# Patient Record
Sex: Male | Born: 1955
Health system: Southern US, Community
[De-identification: ages and names within clinical notes are randomized; demographics above are authoritative.]

## PROBLEM LIST (undated history)

## (undated) DIAGNOSIS — I10 Essential (primary) hypertension: Secondary | ICD-10-CM

## (undated) DIAGNOSIS — IMO0002 Reserved for concepts with insufficient information to code with codable children: Secondary | ICD-10-CM

## (undated) DIAGNOSIS — K219 Gastro-esophageal reflux disease without esophagitis: Secondary | ICD-10-CM

## (undated) DIAGNOSIS — R7303 Prediabetes: Secondary | ICD-10-CM

## (undated) DIAGNOSIS — G4730 Sleep apnea, unspecified: Secondary | ICD-10-CM

## (undated) DIAGNOSIS — R519 Headache, unspecified: Secondary | ICD-10-CM

## (undated) DIAGNOSIS — Z86010 Personal history of colonic polyps: Secondary | ICD-10-CM

## (undated) HISTORY — DX: Prediabetes: R73.03

## (undated) HISTORY — DX: Reserved for concepts with insufficient information to code with codable children: IMO0002

## (undated) HISTORY — PX: TONSILLECTOMY: SUR1361

## (undated) HISTORY — DX: Essential (primary) hypertension: I10

## (undated) HISTORY — DX: Personal history of colonic polyps: Z86.010

---
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
## 1988-10-31 HISTORY — PX: VASECTOMY: SHX75

## 2000-03-20 DIAGNOSIS — I10 Essential (primary) hypertension: Secondary | ICD-10-CM

## 2000-03-20 HISTORY — DX: Essential (primary) hypertension: I10

## 2006-03-22 DIAGNOSIS — G43909 Migraine, unspecified, not intractable, without status migrainosus: Secondary | ICD-10-CM | POA: Insufficient documentation

## 2006-03-23 DIAGNOSIS — E781 Pure hyperglyceridemia: Secondary | ICD-10-CM | POA: Insufficient documentation

## 2006-05-09 ENCOUNTER — Ambulatory Visit: Payer: Self-pay | Admitting: General Surgery

## 2006-05-09 DIAGNOSIS — Z860100 Personal history of colon polyps, unspecified: Secondary | ICD-10-CM

## 2006-05-09 HISTORY — DX: Personal history of colonic polyps: Z86.010

## 2006-05-09 HISTORY — DX: Personal history of colon polyps, unspecified: Z86.0100

## 2006-10-03 ENCOUNTER — Ambulatory Visit: Payer: Self-pay

## 2006-10-11 ENCOUNTER — Other Ambulatory Visit: Payer: Self-pay

## 2006-10-13 ENCOUNTER — Ambulatory Visit: Payer: Self-pay | Admitting: Orthopaedic Surgery

## 2008-07-21 ENCOUNTER — Ambulatory Visit: Payer: Self-pay | Admitting: Family Medicine

## 2009-06-16 ENCOUNTER — Ambulatory Visit: Payer: Self-pay | Admitting: General Surgery

## 2009-07-27 DIAGNOSIS — H8110 Benign paroxysmal vertigo, unspecified ear: Secondary | ICD-10-CM | POA: Insufficient documentation

## 2009-09-02 DIAGNOSIS — F900 Attention-deficit hyperactivity disorder, predominantly inattentive type: Secondary | ICD-10-CM | POA: Insufficient documentation

## 2012-02-20 ENCOUNTER — Ambulatory Visit: Payer: Self-pay | Admitting: Gastroenterology

## 2012-02-20 HISTORY — PX: UPPER GI ENDOSCOPY: SHX6162

## 2012-10-25 ENCOUNTER — Emergency Department: Payer: Self-pay | Admitting: Emergency Medicine

## 2012-10-25 LAB — URINALYSIS, COMPLETE
Bilirubin,UR: NEGATIVE
Glucose,UR: NEGATIVE mg/dL (ref 0–75)
Ketone: NEGATIVE
Ph: 5 (ref 4.5–8.0)
Protein: NEGATIVE
RBC,UR: 12 /HPF (ref 0–5)
Squamous Epithelial: <1
WBC UR: 4 /HPF (ref 0–5)

## 2012-10-25 LAB — CBC
HCT: 44.9 % (ref 40.0–52.0)
MCH: 32.5 pg (ref 26.0–34.0)
MCHC: 35.9 g/dL (ref 32.0–36.0)
RDW: 13.3 % (ref 11.5–14.5)

## 2012-10-25 LAB — COMPREHENSIVE METABOLIC PANEL
Albumin: 4.6 g/dL (ref 3.4–5.0)
Anion Gap: 11 (ref 7–16)
Calcium, Total: 9 mg/dL (ref 8.5–10.1)
Chloride: 107 mmol/L (ref 98–107)
Co2: 24 mmol/L (ref 21–32)
Creatinine: 1.41 mg/dL — ABNORMAL HIGH (ref 0.60–1.30)
EGFR (African American): >60
Glucose: 172 mg/dL — ABNORMAL HIGH (ref 65–99)
Osmolality: 289 (ref 275–301)
Potassium: 3.3 mmol/L — ABNORMAL LOW (ref 3.5–5.1)
Sodium: 142 mmol/L (ref 136–145)
Total Protein: 7.1 g/dL (ref 6.4–8.2)

## 2013-07-30 ENCOUNTER — Ambulatory Visit: Payer: Self-pay | Admitting: Unknown Physician Specialty

## 2013-08-26 LAB — PSA: PSA: 1.3

## 2013-09-16 ENCOUNTER — Ambulatory Visit: Payer: Self-pay | Admitting: Unknown Physician Specialty

## 2013-09-16 HISTORY — PX: UPPER GI ENDOSCOPY: SHX6162

## 2013-09-18 LAB — PATHOLOGY REPORT

## 2014-03-05 ENCOUNTER — Ambulatory Visit: Payer: Self-pay | Admitting: Family Medicine

## 2014-12-02 LAB — LIPID PANEL
Cholesterol: 160 mg/dL (ref 0–200)
HDL: 37 mg/dL (ref 35–70)
LDL Cholesterol: 60 mg/dL
Triglycerides: 316 mg/dL — AB (ref 40–160)

## 2014-12-02 LAB — BASIC METABOLIC PANEL
BUN: 10 mg/dL (ref 4–21)
CREATININE: 0.9 mg/dL (ref 0.6–1.3)
GLUCOSE: 104 mg/dL
POTASSIUM: 4.2 mmol/L (ref 3.4–5.3)
Sodium: 146 mmol/L (ref 137–147)

## 2014-12-02 LAB — HM COLONOSCOPY

## 2015-01-05 ENCOUNTER — Ambulatory Visit: Payer: Self-pay | Admitting: Neurology

## 2015-07-13 ENCOUNTER — Ambulatory Visit (INDEPENDENT_AMBULATORY_CARE_PROVIDER_SITE_OTHER): Payer: 59 | Admitting: Family Medicine

## 2015-07-13 ENCOUNTER — Encounter: Payer: Self-pay | Admitting: Family Medicine

## 2015-07-13 VITALS — BP 148/84 | HR 68 | Temp 97.8°F | Resp 16 | Ht 69.5 in | Wt 225.0 lb

## 2015-07-13 DIAGNOSIS — Z0000 Encounter for general adult medical examination without abnormal findings: Secondary | ICD-10-CM | POA: Diagnosis not present

## 2015-07-13 DIAGNOSIS — I10 Essential (primary) hypertension: Secondary | ICD-10-CM

## 2015-07-13 DIAGNOSIS — Z86010 Personal history of colonic polyps: Secondary | ICD-10-CM

## 2015-07-13 DIAGNOSIS — E559 Vitamin D deficiency, unspecified: Secondary | ICD-10-CM | POA: Insufficient documentation

## 2015-07-13 DIAGNOSIS — Z125 Encounter for screening for malignant neoplasm of prostate: Secondary | ICD-10-CM

## 2015-07-13 DIAGNOSIS — N400 Enlarged prostate without lower urinary tract symptoms: Secondary | ICD-10-CM | POA: Insufficient documentation

## 2015-07-13 DIAGNOSIS — M79606 Pain in leg, unspecified: Secondary | ICD-10-CM | POA: Insufficient documentation

## 2015-07-13 DIAGNOSIS — R413 Other amnesia: Secondary | ICD-10-CM | POA: Insufficient documentation

## 2015-07-13 DIAGNOSIS — R1310 Dysphagia, unspecified: Secondary | ICD-10-CM | POA: Insufficient documentation

## 2015-07-13 DIAGNOSIS — R209 Unspecified disturbances of skin sensation: Secondary | ICD-10-CM | POA: Insufficient documentation

## 2015-07-13 DIAGNOSIS — L821 Other seborrheic keratosis: Secondary | ICD-10-CM | POA: Insufficient documentation

## 2015-07-13 DIAGNOSIS — Z23 Encounter for immunization: Secondary | ICD-10-CM

## 2015-07-13 NOTE — Patient Instructions (Signed)
DASH Eating Plan °DASH stands for "Dietary Approaches to Stop Hypertension." The DASH eating plan is a healthy eating plan that has been shown to reduce high blood pressure (hypertension). Additional health benefits may include reducing the risk of type 2 diabetes mellitus, heart disease, and stroke. The DASH eating plan may also help with weight loss. °WHAT DO I NEED TO KNOW ABOUT THE DASH EATING PLAN? °For the DASH eating plan, you will follow these general guidelines: °· Choose foods with a percent daily value for sodium of less than 5% (as listed on the food label). °· Use salt-free seasonings or herbs instead of table salt or sea salt. °· Check with your health care provider or pharmacist before using salt substitutes. °· Eat lower-sodium products, often labeled as "lower sodium" or "no salt added." °· Eat fresh foods. °· Eat more vegetables, fruits, and low-fat dairy products. °· Choose whole grains. Look for the word "whole" as the first word in the ingredient list. °· Choose fish and skinless chicken or turkey more often than red meat. Limit fish, poultry, and meat to 6 oz (170 g) each day. °· Limit sweets, desserts, sugars, and sugary drinks. °· Choose heart-healthy fats. °· Limit cheese to 1 oz (28 g) per day. °· Eat more home-cooked food and less restaurant, buffet, and fast food. °· Limit fried foods. °· Cook foods using methods other than frying. °· Limit canned vegetables. If you do use them, rinse them well to decrease the sodium. °· When eating at a restaurant, ask that your food be prepared with less salt, or no salt if possible. °WHAT FOODS CAN I EAT? °Seek help from a dietitian for individual calorie needs. °Grains °Whole grain or whole wheat bread. Brown rice. Whole grain or whole wheat pasta. Quinoa, bulgur, and whole grain cereals. Low-sodium cereals. Corn or whole wheat flour tortillas. Whole grain cornbread. Whole grain crackers. Low-sodium crackers. °Vegetables °Fresh or frozen vegetables  (raw, steamed, roasted, or grilled). Low-sodium or reduced-sodium tomato and vegetable juices. Low-sodium or reduced-sodium tomato sauce and paste. Low-sodium or reduced-sodium canned vegetables.  °Fruits °All fresh, canned (in natural juice), or frozen fruits. °Meat and Other Protein Products °Ground beef (85% or leaner), grass-fed beef, or beef trimmed of fat. Skinless chicken or turkey. Ground chicken or turkey. Pork trimmed of fat. All fish and seafood. Eggs. Dried beans, peas, or lentils. Unsalted nuts and seeds. Unsalted canned beans. °Dairy °Low-fat dairy products, such as skim or 1% milk, 2% or reduced-fat cheeses, low-fat ricotta or cottage cheese, or plain low-fat yogurt. Low-sodium or reduced-sodium cheeses. °Fats and Oils °Tub margarines without trans fats. Light or reduced-fat mayonnaise and salad dressings (reduced sodium). Avocado. Safflower, olive, or canola oils. Natural peanut or almond butter. °Other °Unsalted popcorn and pretzels. °The items listed above may not be a complete list of recommended foods or beverages. Contact your dietitian for more options. °WHAT FOODS ARE NOT RECOMMENDED? °Grains °White bread. White pasta. White rice. Refined cornbread. Bagels and croissants. Crackers that contain trans fat. °Vegetables °Creamed or fried vegetables. Vegetables in a cheese sauce. Regular canned vegetables. Regular canned tomato sauce and paste. Regular tomato and vegetable juices. °Fruits °Dried fruits. Canned fruit in light or heavy syrup. Fruit juice. °Meat and Other Protein Products °Fatty cuts of meat. Ribs, chicken wings, bacon, sausage, bologna, salami, chitterlings, fatback, hot dogs, bratwurst, and packaged luncheon meats. Salted nuts and seeds. Canned beans with salt. °Dairy °Whole or 2% milk, cream, half-and-half, and cream cheese. Whole-fat or sweetened yogurt. Full-fat   cheeses or blue cheese. Nondairy creamers and whipped toppings. Processed cheese, cheese spreads, or cheese  curds. °Condiments °Onion and garlic salt, seasoned salt, table salt, and sea salt. Canned and packaged gravies. Worcestershire sauce. Tartar sauce. Barbecue sauce. Teriyaki sauce. Soy sauce, including reduced sodium. Steak sauce. Fish sauce. Oyster sauce. Cocktail sauce. Horseradish. Ketchup and mustard. Meat flavorings and tenderizers. Bouillon cubes. Hot sauce. Tabasco sauce. Marinades. Taco seasonings. Relishes. °Fats and Oils °Butter, stick margarine, lard, shortening, ghee, and bacon fat. Coconut, palm kernel, or palm oils. Regular salad dressings. °Other °Pickles and olives. Salted popcorn and pretzels. °The items listed above may not be a complete list of foods and beverages to avoid. Contact your dietitian for more information. °WHERE CAN I FIND MORE INFORMATION? °National [employer], [employer], [employer] Institute: www.nhlbi.nih.gov/health/health-topics/topics/dash/ °Document Released: 10/06/2011 Document Revised: 03/03/2014 Document Reviewed: 08/21/2013 °ExitCare® Patient Information ©2015 [employer]. This information is not intended to replace advice given to you by your health care provider. Make sure you discuss any questions you have with your health care provider. ° °

## 2015-07-13 NOTE — Progress Notes (Signed)
Patient: Arthur Davis, Male    DOB: Oct 11, 1956, 59 y.o.   MRN: [id number] Visit Date: 07/13/2015  Today's Provider: Lelon Huh, MD   Chief Complaint  Patient presents with  . Annual Exam  . Hypertension    follow up  . Hyperlipidemia    follow up   Subjective:    Annual physical exam Arthur Davis is a 59 y.o. male who presents today for health maintenance and complete physical. He feels well. He reports exercising  2-3 times a week. He reports he is sleeping poorly.  -----------------------------------------------------------------   Hypertension, follow-up:  BP Readings from Last 3 Encounters:  12/02/14 124/72    He was last seen for hypertension 7 months ago.  BP at that visit was 124/72. Management since that visit includes  none. He reports good compliance with treatment. He is not having side effects.   He is exercising. He is adherent to low salt diet.   Outside blood pressures are  253-664 systolic over 40-347 diastolic. He is experiencing lower extremity edema.  Patient denies chest pain, chest pressure/discomfort, claudication, dyspnea, exertional chest pressure/discomfort, fatigue, irregular heart beat, near-syncope, orthopnea, palpitations, paroxysmal nocturnal dyspnea, syncope and tachypnea.   Cardiovascular risk factors include advanced age (older than 50 for men, 53 for women), hypertension and male gender.  Use of agents associated with hypertension: none.     Weight trend: increasing steadily Wt Readings from Last 3 Encounters:  12/02/14 218 lb (98.884 kg)    Current diet: in general, a "healthy" diet    ------------------------------------------------------------------------   Lipid/Cholesterol, Follow-up:   Last seen for this7 months ago.  Management changes since that visit include  none. . Last Lipid Panel:    Component Value Date/Time   CHOL 160 12/02/2014   TRIG 316* 12/02/2014   HDL 37 12/02/2014   LDLCALC 60  12/02/2014    Risk factors for vascular disease include hypertension  He reports good compliance with treatment. He is not having side effects.  Current symptoms include none and have been stable. Weight trend: increasing steadily Prior visit with dietician: no Current diet: in general, a "healthy" diet   Current exercise: cardio and weights  Wt Readings from Last 3 Encounters:  12/02/14 218 lb (98.884 kg)    ------------------------------------------------------------------- Follow up Vitamin D Deficency: Last office visit was 7 months ago and no changes were made. Current treatment include Vitamin D  2,000 units five tablets daily. Patient reports good compliance with treatment and good tolerance.  Follow up Memory Impairment: Last office visit was 7 months ago. Management at last visit includes ordering labs and referring patient to Neurology. Since last visit patient has been seen by Neurologist Dr. Manuella Ghazi . During this consultation visit patient  Completed an Epworth Sleepiness Scale in which patient scored 18/24. Sleep study was also ordered. Today patient comes in stating his memory is some what better than the last visit.   Review of Systems  Constitutional: Positive for unexpected weight change. Negative for fever, chills, appetite change and fatigue.  HENT: Negative for congestion, ear pain, hearing loss, nosebleeds and trouble swallowing.   Eyes: Negative for pain and visual disturbance.  Respiratory: Negative for cough, chest tightness and shortness of breath.   Cardiovascular: Positive for leg swelling. Negative for chest pain and palpitations.  Gastrointestinal: Negative for nausea, vomiting, abdominal pain, diarrhea, constipation and blood in stool.  Endocrine: Negative for polydipsia, polyphagia and polyuria.  Genitourinary: Positive for frequency. Negative for  dysuria and flank pain.  Musculoskeletal: Negative for myalgias, back pain, joint swelling, arthralgias and  neck stiffness.  Skin: Negative for color change, rash and wound.  Neurological: Negative for dizziness, tremors, seizures, speech difficulty, weakness, light-headedness and headaches.  Psychiatric/Behavioral: Negative for behavioral problems, confusion, sleep disturbance, dysphoric mood and decreased concentration. The patient is not nervous/anxious.   All other systems reviewed and are negative.   Social History He  reports that he has never smoked. He has never used smokeless tobacco. He reports that he drinks alcohol. He reports that he does not use illicit drugs. Social History   Social History  . Marital Status: Married    Spouse Name: N/A  . Number of Children: 5  . Years of Education: N/A   Occupational History  . HVAC work     Social History Main Topics  . Smoking status: Never Smoker   . Smokeless tobacco: Never Used  . Alcohol Use: Yes     Comment: Occasional  . Drug Use: No  . Sexual Activity: Not Asked   Other Topics Concern  . None   Social History Narrative    Patient Active Problem List   Diagnosis Date Noted  . Benign fibroma of prostate 07/13/2015  . Can't get food down 07/13/2015  . H/O adenomatous polyp of colon 07/13/2015  . Leg pain 07/13/2015  . Bad memory 07/13/2015  . Disturbance of skin sensation 07/13/2015  . Basal cell papilloma 07/13/2015  . Avitaminosis D 07/13/2015  . Amnesia 05/05/2010  . ADD (attention deficit hyperactivity disorder, inattentive type) 09/02/2009  . Benign paroxysmal positional nystagmus 07/27/2009  . Hyperglyceridemia, pure 03/23/2006  . Headache, migraine 03/22/2006  . Essential (primary) hypertension 03/20/2000    Past Surgical History  Procedure Laterality Date  . Vasectomy  1990  . Nasal abscess  1960    no information provider  . Appendectomy  1960  . Tonsillectomy and adenoidectomy  1960  . Brain surgery  1960's    Removed pressure from brain caused by head injury  . Upper gi endoscopy  02/20/12     [facility], Normal Esophagus, Nornal Stomach, Normal duodenum, Dr. Candace Cruise; Dilated for dysphagia  . Upper gi endoscopy  09/16/2013    Dr. Tiffany Kocher; Changes suspicious for eosinophilic esophaigitis. Normal stomach and duodenum    Family History  Family Status  Relation Status Death Age  . Mother Alive     poor health ill with lymphedema  . Father Deceased 24    died from lung cancer  . Brother Deceased     complications from HIV  . Brother Deceased 28    died from a motor vehicle accident, had a MI while driving died from injury sustained in [place]   His family history is not on file.    Allergies  Allergen Reactions  . Sulfa Antibiotics     doesn't remember things he should    Previous Medications   CHOLECALCIFEROL (VITAMIN D3) 2000 UNITS TABS    Take 5 tablets by mouth daily.    FLAXSEED, LINSEED, (RA FLAX SEED OIL 1000 PO)    Take 1 capsule by mouth daily.   IRBESARTAN-HYDROCHLOROTHIAZIDE (AVALIDE) 300-12.5 MG PER TABLET    Take 1 tablet by mouth daily.   KRILL OIL PO    Take 100 mg by mouth daily.    OMEGA-3 FATTY ACIDS (FISH OIL CONCENTRATE) 1000 MG CAPS    Take 1 capsule by mouth daily.    OMEPRAZOLE (PRILOSEC) 40 MG CAPSULE  Take 40 mg by mouth daily.    SAW PALMETTO 500 MG CAPSULE    Take 500 mg by mouth daily.   TOPIRAMATE (TOPAMAX) 25 MG TABLET    Take 1 tablet by mouth daily.     Patient Care Team: Birdie Sons, MD as PCP - General (Family Medicine)     Objective:   Vitals: BP 148/84 mmHg  Pulse 68  Temp(Src) 97.8 F (36.6 C) (Oral)  Resp 16  Ht 5' 9.5" (1.765 m)  Wt 225 lb (102.059 kg)  BMI 32.76 kg/m2  SpO2 97%   Physical Exam   General Appearance:    Alert, cooperative, no distress, appears stated age  Head:    Normocephalic, without obvious abnormality, atraumatic  Eyes:    PERRL, conjunctiva/corneas clear, EOM's intact, fundi    benign, both eyes       Ears:    Normal TM's and external ear canals, both ears  Nose:   Nares normal, septum midline, mucosa  normal, no drainage   or sinus tenderness  Throat:   Lips, mucosa, and tongue normal; teeth and gums normal  Neck:   Supple, symmetrical, trachea midline, no adenopathy;       thyroid:  No enlargement/tenderness/nodules; no carotid   bruit or JVD  Back:     Symmetric, no curvature, ROM normal, no CVA tenderness  Lungs:     Clear to auscultation bilaterally, respirations unlabored  Chest wall:    No tenderness or deformity  Heart:    Regular rate and rhythm, S1 and S2 normal, no murmur, rub   or gallop  Abdomen:     Soft, non-tender, bowel sounds active all four quadrants,    no masses, no organomegaly  Genitalia:    deferred  Rectal:    the prostate is enlarged at the bilateral, with an approx volume of 30 gms, negative bulge  Extremities:   Extremities normal, atraumatic, no cyanosis or edema  Pulses:   2+ and symmetric all extremities  Skin:   Skin color, texture, turgor normal, no rashes or lesions  Lymph nodes:   Cervical, supraclavicular, and axillary nodes normal  Neurologic:   CNII-XII intact. Normal strength, sensation and reflexes      throughout    Depression Screen PHQ 2/9 Scores 07/13/2015  PHQ - 2 Score 0  PHQ- 9 Score 5      Assessment & Plan:     Routine Health Maintenance and Physical Exam  Exercise Activities and Dietary recommendations Goals    None      Immunization History  Administered Date(s) Administered  . Tdap 05/12/2012    Health Maintenance  Topic Date Due  . HIV Screening  06/26/1970  . COLONOSCOPY  09/26/1916  . INFLUENZA VACCINE  06/01/2015  . ZOSTAVAX  06/27/2015      Discussed health benefits of physical activity, and encouraged him to engage in regular exercise appropriate for his age and condition.    --------------------------------------------------------------------

## 2015-07-14 ENCOUNTER — Telehealth: Payer: Self-pay | Admitting: *Deleted

## 2015-07-14 LAB — PSA: Prostate Specific Ag, Serum: 0.9 ng/mL (ref 0.0–4.0)

## 2015-07-14 NOTE — Telephone Encounter (Signed)
Patient notified of results. Expressed understanding.

## 2015-07-14 NOTE — Telephone Encounter (Signed)
-----   Message from Birdie Sons, MD sent at 07/14/2015  7:49 AM EDT ----- Regarding: PSA Please advise PSA is normal. Check once a year.

## 2015-09-10 DIAGNOSIS — G4733 Obstructive sleep apnea (adult) (pediatric): Secondary | ICD-10-CM | POA: Insufficient documentation

## 2015-09-10 DIAGNOSIS — E669 Obesity, unspecified: Secondary | ICD-10-CM | POA: Insufficient documentation

## 2015-09-28 ENCOUNTER — Other Ambulatory Visit: Payer: Self-pay | Admitting: Family Medicine

## 2015-09-28 ENCOUNTER — Encounter: Payer: Self-pay | Admitting: Family Medicine

## 2015-09-28 ENCOUNTER — Ambulatory Visit (INDEPENDENT_AMBULATORY_CARE_PROVIDER_SITE_OTHER): Payer: 59 | Admitting: Family Medicine

## 2015-09-28 VITALS — BP 132/70 | HR 71 | Temp 98.0°F | Resp 16 | Wt 223.0 lb

## 2015-09-28 DIAGNOSIS — R1310 Dysphagia, unspecified: Secondary | ICD-10-CM

## 2015-09-28 DIAGNOSIS — M549 Dorsalgia, unspecified: Secondary | ICD-10-CM | POA: Insufficient documentation

## 2015-09-28 DIAGNOSIS — M5489 Other dorsalgia: Secondary | ICD-10-CM

## 2015-09-28 NOTE — Progress Notes (Signed)
Patient: Arthur Davis Male    DOB: April 03, 1956   59 y.o.   MRN: [id number] Visit Date: 09/28/2015  Today's Provider: Lelon Huh, MD   Chief Complaint  Patient presents with  . Back Pain    intermittent x 1 year  . Dysphagia    folllow up   Subjective:    HPI  Back pain: Onset of back pain was 1 year ago. Patient does not recall any injuries to his back. Back pain occurs intermittently. Patient describes the pain as a dull ache. Back pain varies from mild to severe. Patient has been seeing a Chiropractor for this back pain. Patient states his Chiropractor recommend that her see his PCP for treatment and further evaluation.  Pain is located underneath patients right shoulder blade. Back pain worsens when lying down.  Has been pain off and on for several years. He states his chiropractor was concerned since he has seen similar patient's in the past with same symptoms who ended up having gallstones or cancer.    Dysphagia: Patient comes in today reporting that he has been having more difficulty swallowing food. He states 2-3 days ago he was swallowing his food and it became lodged in his esophagus causing him to choke. Patient states he has had trouble swallowing for the past 2 years and it seems to be worsening. Had episode in February and had to be taken to [facility] to have food dislodged and stretch esophagus stretched. Had to have same treatment several years ago by Dr. Tiffany Kocher. Had Barium swallow about 30 years ago. Takes omeprazole most days which controls heartburn.      Allergies  Allergen Reactions  . Sulfa Antibiotics     doesn't remember things he should   Previous Medications   CHOLECALCIFEROL (VITAMIN D3) 2000 UNITS TABS    Take 5 tablets by mouth daily.    FLAXSEED, LINSEED, (RA FLAX SEED OIL 1000 PO)    Take 1 capsule by mouth daily.   IRBESARTAN-HYDROCHLOROTHIAZIDE (AVALIDE) 300-12.5 MG PER TABLET    Take 1 tablet by mouth daily.   KRILL OIL PO    Take 100 mg by  mouth daily.    OMEGA-3 FATTY ACIDS (FISH OIL CONCENTRATE) 1000 MG CAPS    Take 1 capsule by mouth daily.    OMEPRAZOLE (PRILOSEC) 40 MG CAPSULE    Take 40 mg by mouth daily.    SAW PALMETTO 500 MG CAPSULE    Take 500 mg by mouth daily.   TOPIRAMATE (TOPAMAX) 25 MG TABLET    Take 1 tablet by mouth daily.     Review of Systems  Constitutional: Negative for fever, chills and appetite change.  HENT: Positive for sore throat.   Respiratory: Negative for chest tightness, shortness of breath and wheezing.   Cardiovascular: Negative for chest pain and palpitations.  Gastrointestinal: Positive for abdominal pain. Negative for nausea and vomiting.  Musculoskeletal: Positive for back pain.    Social History  Substance Use Topics  . Smoking status: Never Smoker   . Smokeless tobacco: Never Used  . Alcohol Use: Yes     Comment: Occasional   Objective:   BP 132/70 mmHg  Pulse 71  Temp(Src) 98 F (36.7 C) (Oral)  Resp 16  Wt 223 lb (101.152 kg)  SpO2 97%  Physical Exam  General appearance: alert, well developed, well nourished, cooperative and in no distress Head: Normocephalic, without obvious abnormality, atraumatic HEENT: OP/NP pink and clear. No neck masses  visualized or palpated. Normal thyroid.  Lungs: Respirations even and unlabored Extremities: No gross deformities Skin: Skin color, texture, turgor normal. No rashes seen  Psych: Appropriate mood and affect. Neurologic: Mental status: Alert, oriented to person, place, and time, thought content appropriate. MS: Tender along muscles inferior and lateral to right scapula and right flank.     Assessment & Plan:     1. Right-sided back pain, unspecified location MS versus visceral  - US Abdomen Limited RUQ; Future  2. Dysphagia Recurrent with history of food becoming lodged in esophagus. Obtain barium swallow. Consider referral back to GI.  - DG Esophagus; Future  3. Heartburn GERD May be contributing to dysphagia.  Omeprazole controlling subjective symptoms, but not taking every day at this time.       Lelon Huh, MD  [facility]

## 2015-10-01 ENCOUNTER — Ambulatory Visit
Admission: RE | Admit: 2015-10-01 | Discharge: 2015-10-01 | Disposition: A | Discharge status: Home / Self Care | Payer: 59 | Source: Ambulatory Visit | Attending: Family Medicine | Admitting: Family Medicine

## 2015-10-01 DIAGNOSIS — M5489 Other dorsalgia: Secondary | ICD-10-CM | POA: Diagnosis present

## 2015-10-02 ENCOUNTER — Ambulatory Visit: Payer: Self-pay

## 2015-10-02 ENCOUNTER — Ambulatory Visit
Admission: RE | Admit: 2015-10-02 | Discharge: 2015-10-02 | Disposition: A | Discharge status: Home / Self Care | Payer: 59 | Source: Ambulatory Visit | Attending: Family Medicine | Admitting: Family Medicine

## 2015-10-02 DIAGNOSIS — K449 Diaphragmatic hernia without obstruction or gangrene: Secondary | ICD-10-CM | POA: Insufficient documentation

## 2015-10-02 DIAGNOSIS — R1310 Dysphagia, unspecified: Secondary | ICD-10-CM | POA: Diagnosis present

## 2015-10-05 ENCOUNTER — Ambulatory Visit: Payer: 59 | Attending: Family Medicine

## 2015-10-13 ENCOUNTER — Ambulatory Visit (INDEPENDENT_AMBULATORY_CARE_PROVIDER_SITE_OTHER): Payer: 59 | Admitting: Family Medicine

## 2015-10-13 ENCOUNTER — Encounter: Payer: Self-pay | Admitting: Family Medicine

## 2015-10-13 VITALS — BP 124/74 | HR 70 | Temp 98.2°F | Resp 16 | Ht 69.5 in | Wt 223.0 lb

## 2015-10-13 DIAGNOSIS — R1310 Dysphagia, unspecified: Secondary | ICD-10-CM | POA: Diagnosis not present

## 2015-10-13 DIAGNOSIS — I10 Essential (primary) hypertension: Secondary | ICD-10-CM | POA: Diagnosis not present

## 2015-10-13 DIAGNOSIS — K828 Other specified diseases of gallbladder: Secondary | ICD-10-CM | POA: Diagnosis not present

## 2015-10-13 DIAGNOSIS — K219 Gastro-esophageal reflux disease without esophagitis: Secondary | ICD-10-CM

## 2015-10-13 DIAGNOSIS — R072 Precordial pain: Secondary | ICD-10-CM

## 2015-10-13 NOTE — Progress Notes (Signed)
Patient: Arthur Davis Male    DOB: 1956/05/29   59 y.o.   MRN: [id number] Visit Date: 10/13/2015  Today's Provider: Lelon Huh, MD   Chief Complaint  Patient presents with  . Follow-up  . Hypertension  . Dysphagia   Subjective:    HPI   Follow-up for dysphagia from 09/28/2015; barium swallow ordered which identified hiatal hernia. U/s was remarkable only for gallbladder sludge. He has since been taking omeprazole on a more consistent basis, but admits he sometimes forgets to take it. He states he has not had trouble with food getting stuck in throat since he has been taking omeprazole more consistently. Has also been drinking more water since last o.v. And is generally feeling better.    Hypertension, follow-up:  BP Readings from Last 3 Encounters:  09/28/15 132/70  07/13/15 148/84  12/02/14 124/72    He was last seen for hypertension 3 months ago.  BP at that visit was 148/84. Management since that visit includes; recommended the DASH diet. He reports good compliance with treatment. He noticed improvement in BP within days of cutting back sodium in diet.  He is not having side effects. none  He is exercising/ gym. He is adherent to low salt diet.   Outside blood pressures are 120/60. He is experiencing none.  Patient denies none.   Cardiovascular risk factors include none.  Use of agents associated with hypertension: none.     Weight trend: stable Wt Readings from Last 3 Encounters:  09/28/15 223 lb (101.152 kg)  07/13/15 225 lb (102.059 kg)  12/02/14 218 lb (98.884 kg)    Current diet: well balanced  ----------------------------------------------------------------------  He also reports an episode when as he was running up stairs and had chest tightness and dizziness that lasted several months. Felt a little short of breath. He had stress test many years ago and is concerned this may be sign of a heart problem.   Allergies  Allergen Reactions    . Sulfa Antibiotics     doesn't remember things he should   Previous Medications   CHOLECALCIFEROL (VITAMIN D3) 2000 UNITS TABS    Take 5 tablets by mouth daily.    FLAXSEED, LINSEED, (RA FLAX SEED OIL 1000 PO)    Take 1 capsule by mouth daily.   IRBESARTAN-HYDROCHLOROTHIAZIDE (AVALIDE) 300-12.5 MG PER TABLET    Take 1 tablet by mouth daily.   KRILL OIL PO    Take 100 mg by mouth daily.    OMEGA-3 FATTY ACIDS (FISH OIL CONCENTRATE) 1000 MG CAPS    Take 1 capsule by mouth daily.    OMEPRAZOLE (PRILOSEC) 40 MG CAPSULE    Take 40 mg by mouth daily.    SAW PALMETTO 500 MG CAPSULE    Take 500 mg by mouth daily.   TOPIRAMATE (TOPAMAX) 25 MG TABLET    Take 1 tablet by mouth daily.     Review of Systems  Constitutional: Negative for fever, chills and appetite change.  Respiratory: Negative for chest tightness, shortness of breath and wheezing.   Cardiovascular: Negative for chest pain and palpitations.  Gastrointestinal: Negative for nausea, vomiting and abdominal pain.  Psychiatric/Behavioral: Positive for sleep disturbance.       Having trouble falling asleep and staying asleep. More often than not the last 2 weeks.    Social History  Substance Use Topics  . Smoking status: Never Smoker   . Smokeless tobacco: Never Used  . Alcohol Use: Yes  Comment: Occasional   Objective:   BP 124/74 mmHg  Pulse 70  Temp(Src) 98.2 F (36.8 C) (Oral)  Resp 16  Ht 5' 9.5" (1.765 m)  Wt 223 lb (101.152 kg)  BMI 32.47 kg/m2  SpO2 96%  Physical Exam  General appearance: alert, well developed, well nourished, cooperative and in no distress Head: Normocephalic, without obvious abnormality, atraumatic Lungs: Respirations even and unlabored Extremities: No gross deformities Skin: Skin color, texture, turgor normal. No rashes seen  Psych: Appropriate mood and affect. Neurologic: Mental status: Alert, oriented to person, place, and time, thought content appropriate.     Assessment & Plan:      1. Precordial pain He does not take aspirin due to stomach irritation - Exercise Tolerance Test; Future  2. Essential (primary) hypertension Much better since starting DASH diet.   3. Gastroesophageal reflux disease, esophagitis presence not specified Counseled on importance of taking omeprazole every day for the next few months.   4. Dysphagia Likely secondary to GERD .Improving since taking PPI more consistently  5. Gallbladder sludge Increase water consumption.        Lelon Huh, MD  [facility]

## 2015-10-19 ENCOUNTER — Ambulatory Visit
Admission: RE | Admit: 2015-10-19 | Discharge: 2015-10-19 | Disposition: A | Discharge status: Home / Self Care | Payer: 59 | Source: Ambulatory Visit | Attending: Family Medicine | Admitting: Family Medicine

## 2015-10-19 DIAGNOSIS — R072 Precordial pain: Secondary | ICD-10-CM

## 2015-10-19 DIAGNOSIS — K828 Other specified diseases of gallbladder: Secondary | ICD-10-CM | POA: Insufficient documentation

## 2015-10-19 DIAGNOSIS — I10 Essential (primary) hypertension: Secondary | ICD-10-CM | POA: Insufficient documentation

## 2015-10-19 DIAGNOSIS — K219 Gastro-esophageal reflux disease without esophagitis: Secondary | ICD-10-CM | POA: Insufficient documentation

## 2015-10-20 LAB — EXERCISE TOLERANCE TEST
CHL CUP STRESS STAGE 1 GRADE: 0 %
CHL CUP STRESS STAGE 3 GRADE: 10 %
CHL CUP STRESS STAGE 3 HR: 99 {beats}/min
CHL CUP STRESS STAGE 4 DBP: 73 mmHg
CHL CUP STRESS STAGE 4 GRADE: 12 %
CHL CUP STRESS STAGE 4 HR: 112 {beats}/min
CHL CUP STRESS STAGE 4 SBP: 165 mmHg
CHL CUP STRESS STAGE 4 SPEED: 2.4 mph
CHL CUP STRESS STAGE 5 GRADE: 14 %
CHL CUP STRESS STAGE 5 SPEED: 3.4 mph
CHL CUP STRESS STAGE 6 GRADE: 0 %
CHL CUP STRESS STAGE 6 HR: 112 {beats}/min
CHL CUP STRESS STAGE 6 SPEED: 0 mph
CHL CUP STRESS STAGE 7 DBP: 71 mmHg
CSEPEW: 10.1 METS
CSEPPBP: 196 mmHg
CSEPPHR: 139 {beats}/min
CSEPPMHR: 86 %
Stage 1 HR: 68 {beats}/min
Stage 1 Speed: 1 mph
Stage 2 Grade: 0 %
Stage 2 HR: 69 {beats}/min
Stage 2 Speed: 1 mph
Stage 3 DBP: 68 mmHg
Stage 3 SBP: 157 mmHg
Stage 3 Speed: 1.7 mph
Stage 5 DBP: 70 mmHg
Stage 5 HR: 139 {beats}/min
Stage 5 SBP: 196 mmHg
Stage 7 Grade: 0 %
Stage 7 HR: 78 {beats}/min
Stage 7 SBP: 151 mmHg
Stage 7 Speed: 0 mph

## 2015-12-12 ENCOUNTER — Other Ambulatory Visit: Payer: Self-pay | Admitting: Family Medicine

## 2016-02-23 ENCOUNTER — Ambulatory Visit (INDEPENDENT_AMBULATORY_CARE_PROVIDER_SITE_OTHER): Payer: 59 | Admitting: Family Medicine

## 2016-02-23 ENCOUNTER — Encounter: Payer: Self-pay | Admitting: Family Medicine

## 2016-02-23 VITALS — BP 146/82 | HR 72 | Temp 97.9°F | Resp 16 | Wt 222.0 lb

## 2016-02-23 DIAGNOSIS — R42 Dizziness and giddiness: Secondary | ICD-10-CM | POA: Diagnosis not present

## 2016-02-23 DIAGNOSIS — I10 Essential (primary) hypertension: Secondary | ICD-10-CM | POA: Diagnosis not present

## 2016-02-23 NOTE — Progress Notes (Signed)
Patient: Arthur Davis Male    DOB: 04-19-1956   60 y.o.   MRN: [id number] Visit Date: 02/23/2016  Today's Provider: Lelon Huh, MD   Chief Complaint  Patient presents with  . Hypertension   Subjective:    HPI  Hypertension, follow-up:  BP Readings from Last 3 Encounters:  10/13/15 124/74  09/28/15 132/70  07/13/15 148/84    He was last seen for hypertension 4 months ago.  BP at that visit was 124/74. Management since that visit includes no changes. Patient was to continue DASH diet which had initially seemed to help BP considerably. . Stress test was ordered due to patient experiencing Precordial pain. Results were normal and showed no sign of heart disease.  He reports good compliance with treatment. Patient states for the past 3 months his blood pressure has consistently been elevated. Patient uses a wrist monitor to check his blood pressure. He is taking Avalide consistently.  He is not having side effects.  He is exercising. He is adherent to low salt diet.   Outside blood pressures are [id number] systolic reading over 123456 diastolic reading. He is experiencing fatigue.  Patient denies chest pain, chest pressure/discomfort, claudication, dyspnea, exertional chest pressure/discomfort, irregular heart beat, lower extremity edema and near-syncope.   Cardiovascular risk factors include advanced age (older than 19 for men, 29 for women) and hypertension.  Use of agents associated with hypertension: none.     Weight trend: stable Wt Readings from Last 3 Encounters:  10/13/15 223 lb (101.152 kg)  09/28/15 223 lb (101.152 kg)  07/13/15 225 lb (102.059 kg)    Current diet: well balanced  ------------------------------------------------------------------------  He also states he bumped his head about a week ago and has noticed that he gets light headed when he stands up since then. It only lasts a few seconds, and has been improving.     Allergies  Allergen  Reactions  . Sulfa Antibiotics     Cause confusion   Previous Medications   CHOLECALCIFEROL (VITAMIN D3) 2000 UNITS TABS    Take 5 tablets by mouth daily.    FLAXSEED, LINSEED, (RA FLAX SEED OIL 1000 PO)    Take 1 capsule by mouth daily.   IRBESARTAN-HYDROCHLOROTHIAZIDE (AVALIDE) 300-12.5 MG TABLET    TAKE ONE (1) TABLET BY MOUTH EVERY DAY   KRILL OIL PO    Take 100 mg by mouth daily.    OMEGA-3 FATTY ACIDS (FISH OIL CONCENTRATE) 1000 MG CAPS    Take 1 capsule by mouth daily.    OMEPRAZOLE (PRILOSEC) 40 MG CAPSULE    Take 40 mg by mouth daily.    SAW PALMETTO 500 MG CAPSULE    Take 500 mg by mouth daily.   TOPIRAMATE (TOPAMAX) 25 MG TABLET    Take 1 tablet by mouth daily.     Review of Systems  Constitutional: Positive for fever. Negative for chills and appetite change.  Respiratory: Negative for chest tightness, shortness of breath and wheezing.   Cardiovascular: Negative for chest pain and palpitations.  Gastrointestinal: Negative for nausea, vomiting and abdominal pain.  Neurological: Positive for dizziness.    Social History  Substance Use Topics  . Smoking status: Never Smoker   . Smokeless tobacco: Never Used  . Alcohol Use: Yes     Comment: Occasional   Objective:   BP 146/82 mmHg  Pulse 72  Temp(Src) 97.9 F (36.6 C) (Oral)  Resp 16  Wt 222 lb (100.699 kg)  SpO2  97%  Physical Exam   General Appearance:    Alert, cooperative, no distress  Eyes:    PERRL, conjunctiva/corneas clear, EOM's intact       Lungs:     Clear to auscultation bilaterally, respirations unlabored  Heart:    Regular rate and rhythm  Neurologic:   Awake, alert, oriented x 3. No apparent focal neurological           defect.          Assessment & Plan:     1. Essential (primary) hypertension Worsening despite continuing low sodium diet. Will check labs, if normal will add low dose amlodipine.  - Lipid panel - TSH  2. Dizziness Improving. May be secondary to bumping last week.  -  CBC - Comprehensive metabolic panel - TSH       Lelon Huh, MD  [facility]

## 2016-02-24 ENCOUNTER — Telehealth: Payer: Self-pay

## 2016-02-24 LAB — CBC
HEMOGLOBIN: 15.1 g/dL (ref 12.6–17.7)
Hematocrit: 42.8 % (ref 37.5–51.0)
MCH: 31.8 pg (ref 26.6–33.0)
MCHC: 35.3 g/dL (ref 31.5–35.7)
MCV: 90 fL (ref 79–97)
Platelets: 156 10*3/uL (ref 150–379)
RBC: 4.75 x10E6/uL (ref 4.14–5.80)
RDW: 13.7 % (ref 12.3–15.4)
WBC: 7 10*3/uL (ref 3.4–10.8)

## 2016-02-24 LAB — COMPREHENSIVE METABOLIC PANEL
ALBUMIN: 4.4 g/dL (ref 3.6–4.8)
ALT: 27 IU/L (ref 0–44)
AST: 23 IU/L (ref 0–40)
Albumin/Globulin Ratio: 1.9 (ref 1.2–2.2)
Alkaline Phosphatase: 65 IU/L (ref 39–117)
BUN / CREAT RATIO: 15 (ref 10–24)
BUN: 14 mg/dL (ref 8–27)
Bilirubin Total: 1.3 mg/dL — ABNORMAL HIGH (ref 0.0–1.2)
CALCIUM: 9.3 mg/dL (ref 8.6–10.2)
CO2: 22 mmol/L (ref 18–29)
CREATININE: 0.95 mg/dL (ref 0.76–1.27)
Chloride: 100 mmol/L (ref 96–106)
GFR, EST AFRICAN AMERICAN: 100 mL/min/{1.73_m2} (ref >59)
GFR, EST NON AFRICAN AMERICAN: 87 mL/min/{1.73_m2} (ref >59)
GLUCOSE: 110 mg/dL — AB (ref 65–99)
Globulin, Total: 2.3 g/dL (ref 1.5–4.5)
Potassium: 3.9 mmol/L (ref 3.5–5.2)
Sodium: 140 mmol/L (ref 134–144)
TOTAL PROTEIN: 6.7 g/dL (ref 6.0–8.5)

## 2016-02-24 LAB — LIPID PANEL
CHOL/HDL RATIO: 3.9 ratio (ref 0.0–5.0)
Cholesterol, Total: 134 mg/dL (ref 100–199)
HDL: 34 mg/dL — ABNORMAL LOW (ref >39)
LDL CALC: 52 mg/dL (ref 0–99)
Triglycerides: 238 mg/dL — ABNORMAL HIGH (ref 0–149)
VLDL Cholesterol Cal: 48 mg/dL — ABNORMAL HIGH (ref 5–40)

## 2016-02-24 LAB — TSH: TSH: 1.25 u[IU]/mL (ref 0.450–4.500)

## 2016-02-24 MED ORDER — AMLODIPINE BESYLATE 2.5 MG PO TABS: 2.5000 mg | ORAL_TABLET | Freq: Every day | ORAL | Status: DC

## 2016-02-24 NOTE — Telephone Encounter (Signed)
Patient advised as directed below. RX sent to pharmacy. Patient scheduled for follow up appointment.

## 2016-02-24 NOTE — Telephone Encounter (Signed)
-----   Message from Birdie Sons, MD sent at 02/24/2016  7:50 AM EDT ----- Labs normal. Cholesterol is 134. Continue current medications.  Add amlodipine 2.5mg  one tablet daily, #30, rf x 2 for blood pressure. Follow up 6 weeks for BP check.

## 2016-04-05 ENCOUNTER — Encounter: Payer: Self-pay | Admitting: Family Medicine

## 2016-04-05 ENCOUNTER — Ambulatory Visit (INDEPENDENT_AMBULATORY_CARE_PROVIDER_SITE_OTHER): Payer: 59 | Admitting: Family Medicine

## 2016-04-05 VITALS — BP 118/60 | HR 70 | Temp 98.1°F | Resp 16 | Wt 228.0 lb

## 2016-04-05 DIAGNOSIS — I10 Essential (primary) hypertension: Secondary | ICD-10-CM | POA: Diagnosis not present

## 2016-04-05 NOTE — Progress Notes (Signed)
Patient: Arthur Davis Male    DOB: 1956/09/14   60 y.o.   MRN: [id number] Visit Date: 04/05/2016  Today's Provider: Lelon Huh, MD   Chief Complaint  Patient presents with  . Hypertension    6 week follow up   Subjective:    HPI  Hypertension, follow-up:  BP Readings from Last 3 Encounters:  02/23/16 146/82  10/13/15 124/74  09/28/15 132/70    He was last seen for hypertension 6 weeks ago.  BP at that visit was 146/82. Management since that visit includes adding Amlodipine 2.5mg  daily. He reports good compliance with treatment. He is not having side effects.  He is exercising. He is adherent to low salt diet.   Outside blood pressures are running: 130/80. He is experiencing none.  Patient denies chest pain, chest pressure/discomfort, claudication, dyspnea, exertional chest pressure/discomfort, fatigue, irregular heart beat, lower extremity edema, near-syncope, orthopnea, palpitations, paroxysmal nocturnal dyspnea, syncope and tachypnea.   Cardiovascular risk factors include advanced age (older than 80 for men, 4 for women) and male gender.  Use of agents associated with hypertension: none.     Weight trend: increasing steadily Wt Readings from Last 3 Encounters:  02/23/16 222 lb (100.699 kg)  10/13/15 223 lb (101.152 kg)  09/28/15 223 lb (101.152 kg)    Current diet: in general, a "healthy" diet  , low salt  ------------------------------------------------------------------------      Allergies  Allergen Reactions  . Sulfa Antibiotics     Cause confusion   Previous Medications   AMLODIPINE (NORVASC) 2.5 MG TABLET    Take 1 tablet (2.5 mg total) by mouth daily.   CHOLECALCIFEROL (VITAMIN D3) 2000 UNITS TABS    Take 5 tablets by mouth daily.    FLAXSEED, LINSEED, (RA FLAX SEED OIL 1000 PO)    Take 1 capsule by mouth daily.   IRBESARTAN-HYDROCHLOROTHIAZIDE (AVALIDE) 300-12.5 MG TABLET    TAKE ONE (1) TABLET BY MOUTH EVERY DAY   KRILL OIL PO     Take 100 mg by mouth daily.    OMEGA-3 FATTY ACIDS (FISH OIL CONCENTRATE) 1000 MG CAPS    Take 1 capsule by mouth daily.    OMEPRAZOLE (PRILOSEC) 40 MG CAPSULE    Take 40 mg by mouth daily.    SAW PALMETTO 500 MG CAPSULE    Take 500 mg by mouth daily.   TOPIRAMATE (TOPAMAX) 25 MG TABLET    Take 1 tablet by mouth daily.     Review of Systems  Constitutional: Negative for fever, chills and appetite change.  Respiratory: Negative for chest tightness, shortness of breath and wheezing.   Cardiovascular: Negative for chest pain and palpitations.  Gastrointestinal: Negative for nausea, vomiting and abdominal pain.    Social History  Substance Use Topics  . Smoking status: Never Smoker   . Smokeless tobacco: Never Used  . Alcohol Use: 0.0 oz/week    0 Standard drinks or equivalent per week     Comment: rare   Objective:   BP 118/60 mmHg  Pulse 70  Temp(Src) 98.1 F (36.7 C) (Oral)  Resp 16  Wt 228 lb (103.42 kg)  Physical Exam   General Appearance:    Alert, cooperative, no distress  Eyes:    PERRL, conjunctiva/corneas clear, EOM's intact       Lungs:     Clear to auscultation bilaterally, respirations unlabored  Heart:    Regular rate and rhythm  Neurologic:   Awake, alert, oriented x 3.  No apparent focal neurological           defect.           Assessment & Plan:     1. Essential (primary) hypertension Much better since adding amlodipine. Continue current medications.  Can take OTC Fibercon or metamucil if constipation becomes more persistent problem.      The entirety of the information documented in the History of Present Illness, Review of Systems and Physical Exam were personally obtained by me. Portions of this information were initially documented by Meyer Cory, CMA and reviewed by me for thoroughness and accuracy.    Lelon Huh, MD  [facility]

## 2016-04-12 ENCOUNTER — Ambulatory Visit (INDEPENDENT_AMBULATORY_CARE_PROVIDER_SITE_OTHER): Payer: 59 | Admitting: Family Medicine

## 2016-04-12 ENCOUNTER — Encounter: Payer: Self-pay | Admitting: Family Medicine

## 2016-04-12 VITALS — BP 112/62 | HR 68 | Temp 98.1°F | Resp 16 | Ht 69.5 in | Wt 226.0 lb

## 2016-04-12 DIAGNOSIS — N50819 Testicular pain, unspecified: Secondary | ICD-10-CM | POA: Diagnosis not present

## 2016-04-12 MED ORDER — DOXYCYCLINE HYCLATE 100 MG PO TABS: 100.0000 mg | ORAL_TABLET | Freq: Two times a day (BID) | ORAL | Status: AC

## 2016-04-12 NOTE — Progress Notes (Signed)
Patient: Arthur Davis Male    DOB: Mar 20, 1956   60 y.o.   MRN: [id number] Visit Date: 04/12/2016  Today's Provider: Lelon Huh, MD   Chief Complaint  Patient presents with  . Groin Pain   Subjective:    Groin Pain The patient's primary symptoms include pelvic pain and testicular pain. The patient's pertinent negatives include no genital injury, genital itching, genital lesions, penile discharge, penile pain, priapism or scrotal swelling. This is a recurrent problem. The current episode started more than 1 year ago. The problem occurs intermittently. The problem has been gradually worsening. The pain is mild. Associated symptoms include frequency, hesitancy and joint swelling. Pertinent negatives include no abdominal pain, anorexia, chest pain, chills, constipation, coughing, diarrhea, discolored urine, dysuria, fever, flank pain, headaches, hematuria, joint pain, nausea, painful intercourse, rash, shortness of breath, sore throat, urgency, urinary retention or vomiting. The testicular pain affects the right testicle. The color of the testicles is normal. Exacerbated by: tight clothing  He has tried nothing for the symptoms.   Patient has had pain in groin area for over a year. Pain has gotten worse lately. Pain is usually caused by tighter clothing. Today pain is more in the right testicle area and only when he is walking.    Allergies  Allergen Reactions  . Sulfa Antibiotics     Cause confusion   Current Meds  Medication Sig  . amLODipine (NORVASC) 2.5 MG tablet Take 1 tablet (2.5 mg total) by mouth daily.  . Cholecalciferol (VITAMIN D3) 2000 UNITS TABS Take 5 tablets by mouth daily.   . Flaxseed, Linseed, (RA FLAX SEED OIL 1000 PO) Take 1 capsule by mouth daily.  . irbesartan-hydrochlorothiazide (AVALIDE) 300-12.5 MG tablet TAKE ONE (1) TABLET BY MOUTH EVERY DAY  . KRILL OIL PO Take 100 mg by mouth daily.   . Omega-3 Fatty Acids (FISH OIL CONCENTRATE) 1000 MG CAPS Take 1  capsule by mouth daily.   Marland Kitchen omeprazole (PRILOSEC) 40 MG capsule Take 40 mg by mouth daily.   . saw palmetto 500 MG capsule Take 500 mg by mouth daily.  Marland Kitchen topiramate (TOPAMAX) 25 MG tablet Take 1 tablet by mouth daily.     Review of Systems  Constitutional: Negative for fever, chills and appetite change.  HENT: Negative for sore throat.   Respiratory: Negative for cough, chest tightness, shortness of breath and wheezing.   Cardiovascular: Negative for chest pain and palpitations.  Gastrointestinal: Negative for nausea, vomiting, abdominal pain, diarrhea, constipation and anorexia.  Genitourinary: Positive for hesitancy, frequency, testicular pain and pelvic pain. Negative for dysuria, urgency, flank pain, discharge, scrotal swelling and penile pain.  Musculoskeletal: Negative for joint pain.  Skin: Negative for rash.  Neurological: Negative for headaches.    Social History  Substance Use Topics  . Smoking status: Never Smoker   . Smokeless tobacco: Never Used  . Alcohol Use: 0.0 oz/week    0 Standard drinks or equivalent per week     Comment: rare   Objective:   BP 112/62 mmHg  Pulse 68  Temp(Src) 98.1 F (36.7 C) (Oral)  Resp 16  Ht 5' 9.5" (1.765 m)  Wt 226 lb (102.513 kg)  BMI 32.91 kg/m2  SpO2 95%  Physical Exam  General Appearance:    Alert, cooperative, no distress  GU:   Moderate tenderness of right testicle, slightly swollen. No erythema. No penile discharge.         Assessment & Plan:  1. Testicular pain Cover with doxycyline while awaiting ultrasound.  - doxycycline (VIBRA-TABS) 100 MG tablet; Take 1 tablet (100 mg total) by mouth 2 (two) times daily.  Dispense: 28 tablet; Refill: 0 - US Scrotum; Future - [place] Art/Ven Flow Abd Pelv Doppler; Future       Lelon Huh, MD  [facility]

## 2016-04-14 ENCOUNTER — Ambulatory Visit
Admission: RE | Admit: 2016-04-14 | Discharge: 2016-04-14 | Disposition: A | Discharge status: Home / Self Care | Payer: 59 | Source: Ambulatory Visit | Attending: Family Medicine | Admitting: Family Medicine

## 2016-04-14 DIAGNOSIS — N50819 Testicular pain, unspecified: Secondary | ICD-10-CM

## 2016-04-14 DIAGNOSIS — N503 Cyst of epididymis: Secondary | ICD-10-CM | POA: Diagnosis not present

## 2016-04-14 DIAGNOSIS — N5089 Other specified disorders of the male genital organs: Secondary | ICD-10-CM | POA: Diagnosis present

## 2016-04-21 ENCOUNTER — Telehealth: Payer: Self-pay | Admitting: Family Medicine

## 2016-04-21 NOTE — Telephone Encounter (Signed)
Wife would like someone to call and explain to her the test results from his [place].on his testicles.  Someone called him but he didn't understand everything she was telling him.  Wife's call back number is [phone]  Thanks Con Memos

## 2016-04-21 NOTE — Telephone Encounter (Signed)
Returned call to pt's wife Adela Lank and gave her the results from scrotum US.

## 2016-05-17 ENCOUNTER — Other Ambulatory Visit: Payer: Self-pay | Admitting: Family Medicine

## 2016-05-17 DIAGNOSIS — I10 Essential (primary) hypertension: Secondary | ICD-10-CM

## 2016-09-25 IMAGING — RF DG ESOPHAGUS
11 of 14 series · 15 of 22 positions shown · non-contrast
Comparison: No prior.

CLINICAL DATA: Dysphagia.

EXAM:
ESOPHOGRAM / BARIUM SWALLOW / BARIUM TABLET STUDY
TECHNIQUE: Combined double contrast and single contrast examination performed
using effervescent crystals, thick barium liquid, and thin barium
liquid. The patient was observed with fluoroscopy swallowing a 13 mm
barium sulphate tablet.
FLUOROSCOPY TIME:  Radiation Exposure Index (as provided by the
fluoroscopic device): 57.3 mGy

[Series 1: fluoro_barium 2fps_bw · 0.17mm/px · 2 of 7 frames shown (1 of 11)]
[frame 2/7]
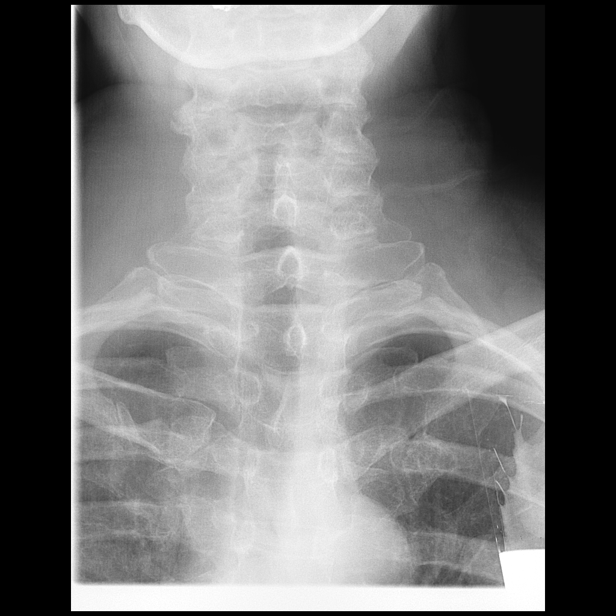
[frame 6/7]
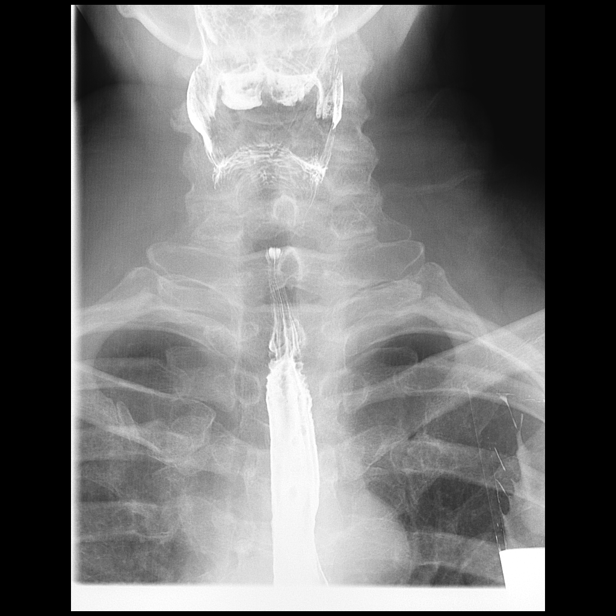

[Series 2: fluoro_barium 2fps_bw · 0.17mm/px · 2 of 9 frames shown (2 of 11)]
[frame 2/9]
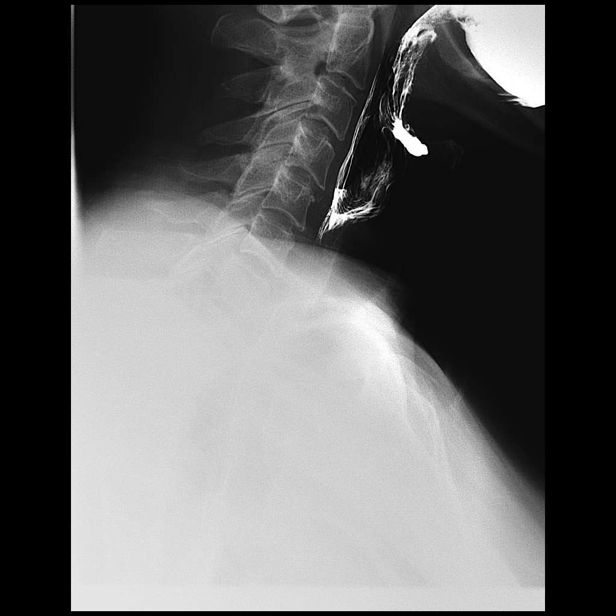
[frame 8/9]
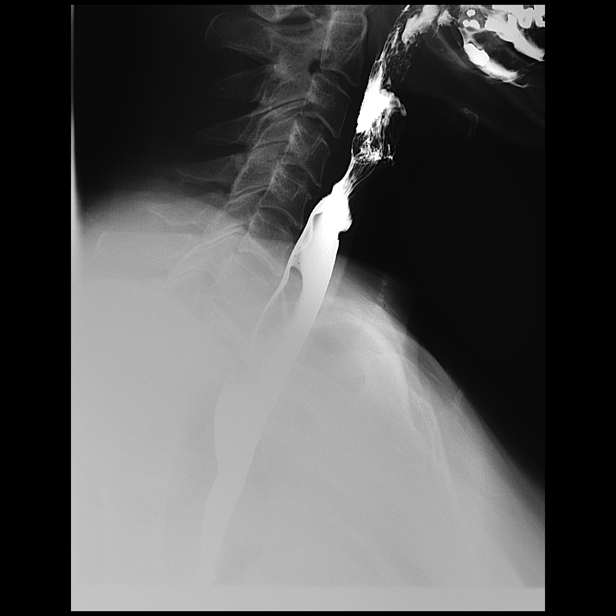

[Series 3: fluoro_barium 2fps_bw · 0.17mm/px · 1 of 1 slices shown (3 of 11)]
[im 1/1]
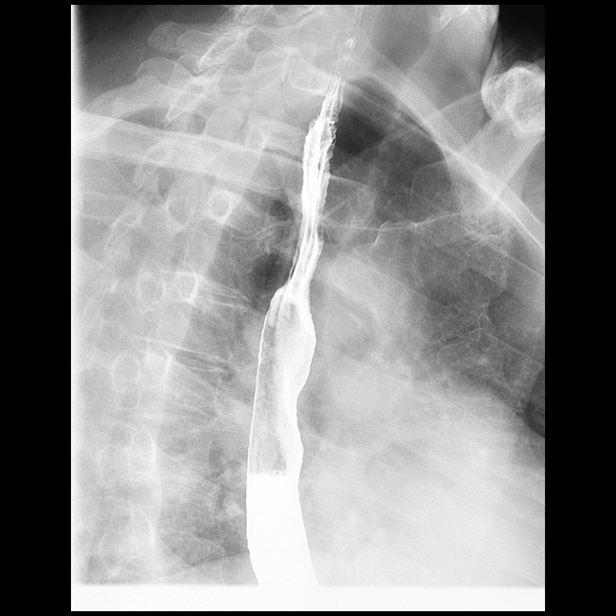

[Series 5: fluoro_barium 2fps_bw · 0.17mm/px · 1 of 1 slices shown (4 of 11)]
[im 1/1]
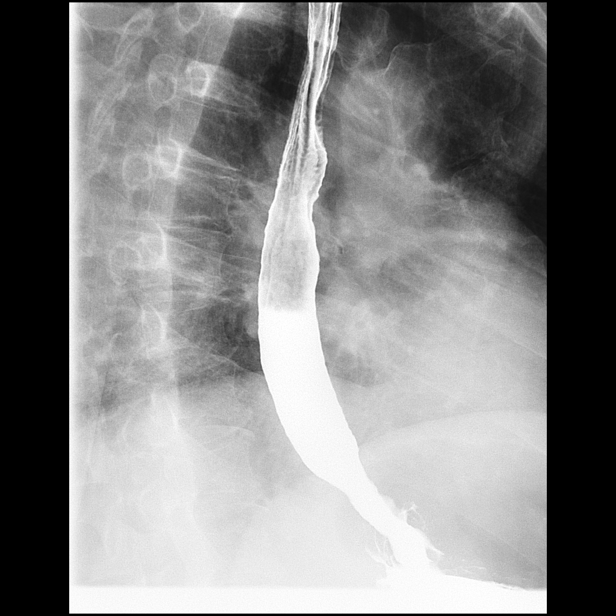

[Series 6: fluoro_barium 2fps_bw · 0.17mm/px · 1 of 1 slices shown (5 of 11)]
[im 1/1]
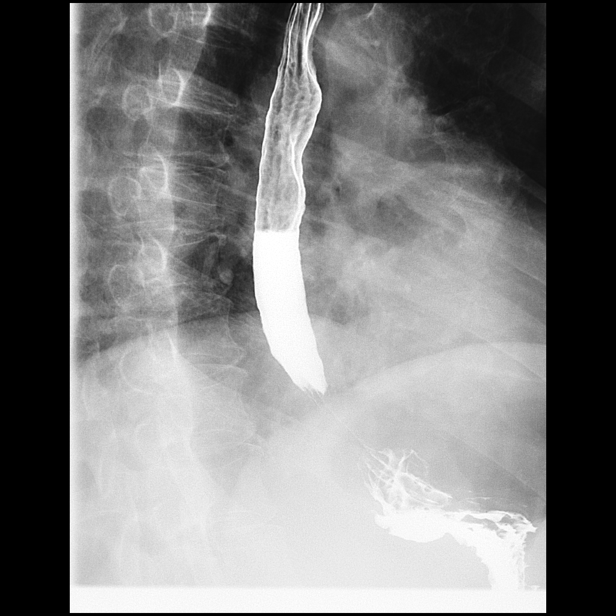

[Series 8: fluoro_barium 2fps_bw · 0.18mm/px · 2 of 2 frames shown (6 of 11)]
[frame 1/2]
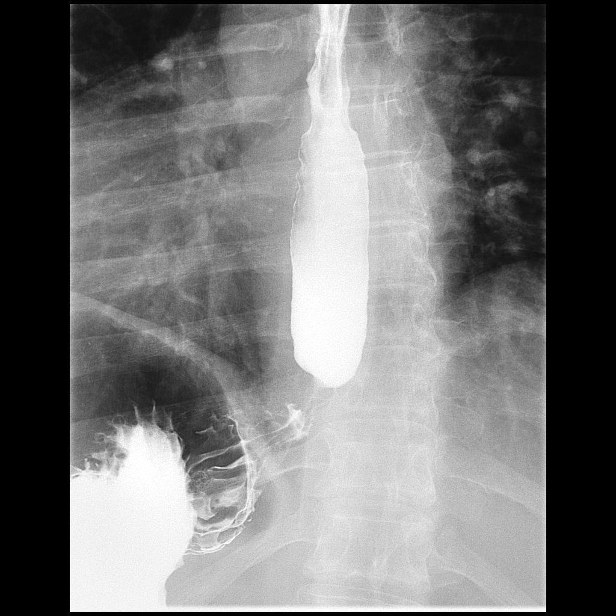
[frame 2/2]
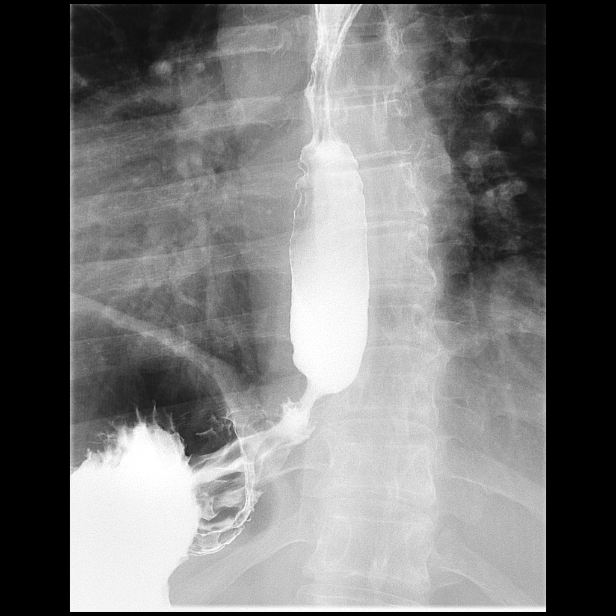

[Series 9: fluoro_barium 2fps_bw · 0.18mm/px · 1 of 1 slices shown (7 of 11)]
[im 1/1]
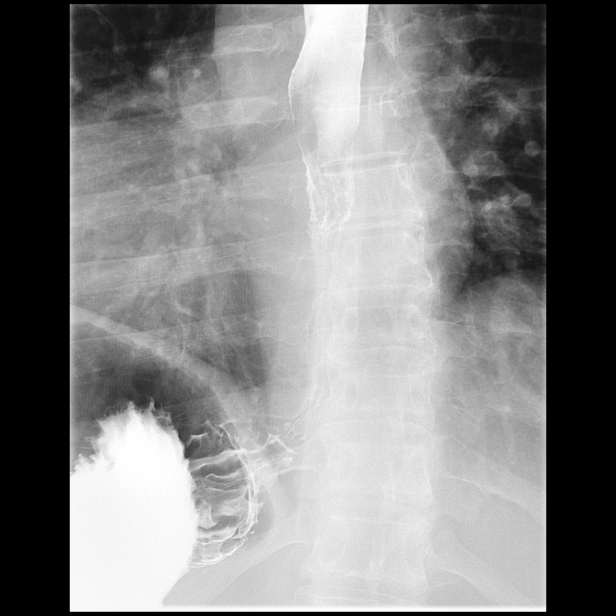

[Series 10: fluoro_barium 2fps_bw · 0.18mm/px · 1 of 2 frames shown (8 of 11)]
[frame 2/2]
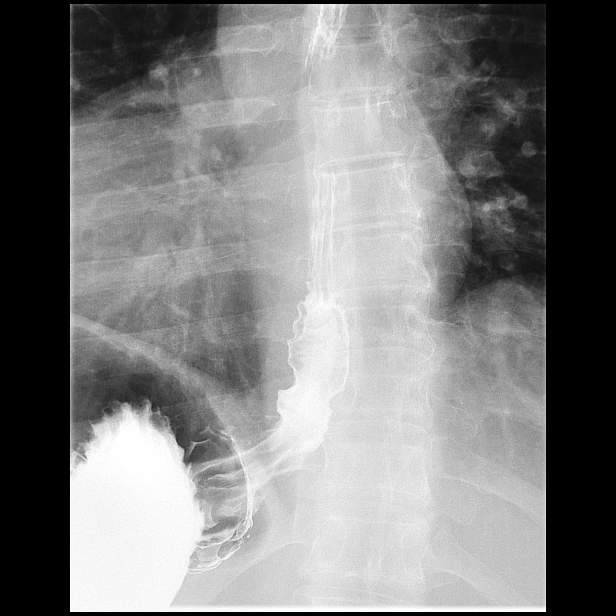

[Series 11: fluoro_barium 2fps_bw · 0.18mm/px · 1 of 1 slices shown (9 of 11)]
[im 1/1]
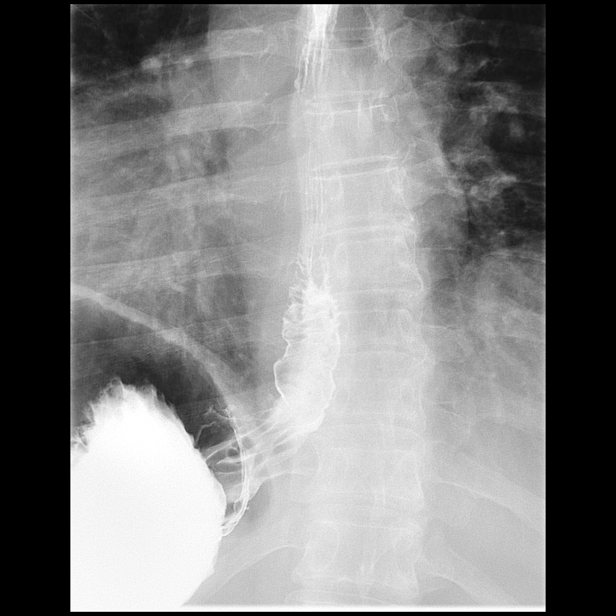

[Series 13: fluoro_barium 2fps_bw · 0.18mm/px · 2 of 2 frames shown (10 of 11)]
[frame 1/2]
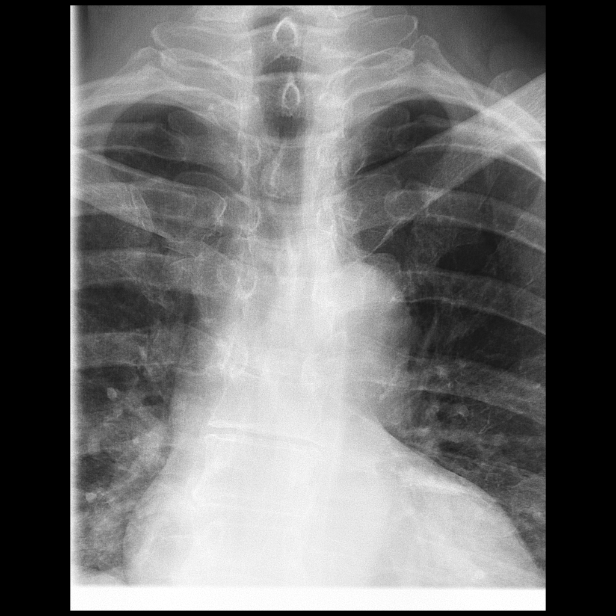
[frame 2/2]
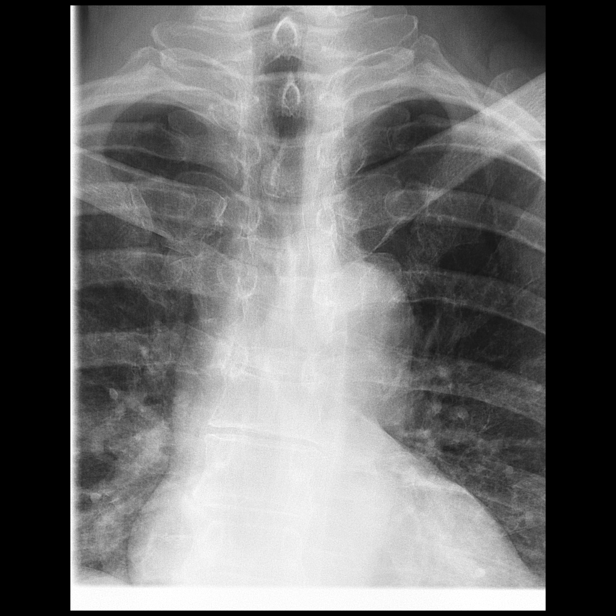

[Series 14: fluoro_barium 2fps_bw · 0.17mm/px · 1 of 2 frames shown (11 of 11)]
[frame 2/2]
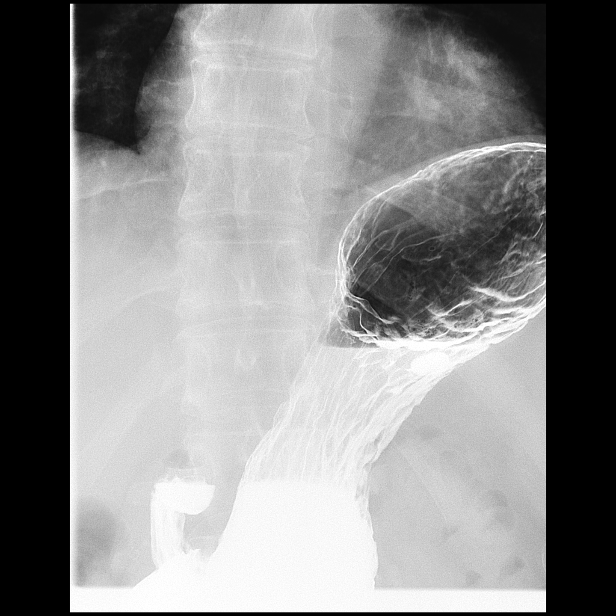

[15 of 22 positions shown; findings below may reference images not displayed]

FINDINGS: Cervical esophagus is normal. Small sliding hiatal hernia with mild
B ring. This does not result in obstruction. Standardized barium
tablet passes easily. No obstructing lesion identified. No reflux.
IMPRESSION: Small hiatal hernia with mild B ring. Standardized barium tablet
passes easily. No reflux. No obstructing lesion identified .

## 2016-11-22 ENCOUNTER — Encounter: Payer: Self-pay | Admitting: Family Medicine

## 2016-11-22 ENCOUNTER — Ambulatory Visit (INDEPENDENT_AMBULATORY_CARE_PROVIDER_SITE_OTHER): Payer: 59 | Admitting: Family Medicine

## 2016-11-22 ENCOUNTER — Other Ambulatory Visit: Payer: Self-pay | Admitting: Family Medicine

## 2016-11-22 VITALS — BP 110/60 | HR 72 | Temp 98.4°F | Resp 16 | Ht 70.0 in | Wt 223.0 lb

## 2016-11-22 DIAGNOSIS — Z0000 Encounter for general adult medical examination without abnormal findings: Secondary | ICD-10-CM | POA: Diagnosis not present

## 2016-11-22 DIAGNOSIS — M79604 Pain in right leg: Secondary | ICD-10-CM | POA: Diagnosis not present

## 2016-11-22 DIAGNOSIS — Z86010 Personal history of colonic polyps: Secondary | ICD-10-CM

## 2016-11-22 DIAGNOSIS — Z125 Encounter for screening for malignant neoplasm of prostate: Secondary | ICD-10-CM | POA: Diagnosis not present

## 2016-11-22 DIAGNOSIS — I10 Essential (primary) hypertension: Secondary | ICD-10-CM

## 2016-11-22 DIAGNOSIS — K219 Gastro-esophageal reflux disease without esophagitis: Secondary | ICD-10-CM | POA: Diagnosis not present

## 2016-11-22 DIAGNOSIS — Z23 Encounter for immunization: Secondary | ICD-10-CM

## 2016-11-22 DIAGNOSIS — E669 Obesity, unspecified: Secondary | ICD-10-CM

## 2016-11-22 DIAGNOSIS — M79605 Pain in left leg: Secondary | ICD-10-CM | POA: Diagnosis not present

## 2016-11-22 DIAGNOSIS — E559 Vitamin D deficiency, unspecified: Secondary | ICD-10-CM

## 2016-11-22 NOTE — Progress Notes (Signed)
Patient: Arthur Davis, Male    DOB: 1955/11/27, 61 y.o.   MRN: [id number] Visit Date: 11/22/2016  Today's Provider: Lelon Huh, MD   Chief Complaint  Patient presents with  . Annual Exam  . Hypertension  . Gastroesophageal Reflux   Subjective:    Annual physical exam Arthur Davis is a 61 y.o. male who presents today for health maintenance and complete physical. He feels well. He reports exercising yes/some. He reports he is sleeping poorly.  ----------------------------------------------------------------    Hypertension, follow-up:  BP Readings from Last 3 Encounters:  11/22/16 110/60  04/12/16 112/62  04/05/16 118/60    He was last seen for hypertension 7 months ago.  BP at that visit was 118/60. Management since that visit includes; no changes.He reports good compliance with treatment. He is not having side effects. none He is exercising. He is adherent to low salt diet.   Outside blood pressures are normal. He is experiencing none.  Patient denies none.   Cardiovascular risk factors include none.  Use of agents associated with hypertension: none.   ----------------------------------------------------------------  Gastroesophageal reflux disease, esophagitis presence not specified From 10/13/2015-no changes.Counseled on importance of taking omeprazole every day for the next few months.  Complains of a lot of pains and cramping in both legs the last few months.    Review of Systems  Constitutional: Positive for fatigue.  HENT: Negative.   Eyes: Negative.   Respiratory: Negative.   Cardiovascular: Negative.   Gastrointestinal: Negative.   Endocrine: Negative.   Genitourinary: Positive for decreased urine volume, enuresis, frequency and urgency.  Musculoskeletal: Positive for myalgias.  Skin: Negative.   Allergic/Immunologic: Negative.   Neurological: Negative.   Hematological: Negative.     Social History      He  reports that he has  never smoked. He has never used smokeless tobacco. He reports that he drinks alcohol. He reports that he does not use drugs.       Social History   Social History  . Marital status: Married    Spouse name: N/A  . Number of children: 5  . Years of education: N/A   Occupational History  . HVAC work     Social History Main Topics  . Smoking status: Never Smoker  . Smokeless tobacco: Never Used  . Alcohol use 0.0 oz/week     Comment: rare  . Drug use: No  . Sexual activity: Not Asked   Other Topics Concern  . None   Social History Narrative  . None    Past Medical History:  Diagnosis Date  . History of colonic polyps 05/09/2006  . Hypertension 03/20/2000     Patient Active Problem List   Diagnosis Date Noted  . GERD (gastroesophageal reflux disease) 10/13/2015  . Gallbladder sludge 10/13/2015  . Right-sided back pain 09/28/2015  . BPH (benign prostatic hyperplasia) 07/13/2015  . Dysphagia 07/13/2015  . H/O adenomatous polyp of colon 07/13/2015  . Leg pain 07/13/2015  . Bad memory 07/13/2015  . Disturbance of skin sensation 07/13/2015  . Seborrheic keratosis 07/13/2015  . Vitamin D deficiency 07/13/2015  . ADD (attention deficit hyperactivity disorder, inattentive type) 09/02/2009  . Benign paroxysmal vertigo 07/27/2009  . Hyperglyceridemia, pure 03/23/2006  . Headache, migraine 03/22/2006  . Essential (primary) hypertension 03/20/2000    Past Surgical History:  Procedure Laterality Date  . APPENDECTOMY  1960  . BRAIN SURGERY  1960's   Removed pressure from brain caused by head injury  .  nasal abscess  1960   no information provider  . TONSILLECTOMY AND ADENOIDECTOMY  1960  . UPPER GI ENDOSCOPY  02/20/12   [facility], Normal Esophagus, Nornal Stomach, Normal duodenum, Dr. Candace Cruise; Dilated for dysphagia  . UPPER GI ENDOSCOPY  09/16/2013   Dr. Tiffany Kocher; Changes suspicious for eosinophilic esophaigitis. Normal stomach and duodenum  . VASECTOMY  1990    Family History          Family Status  Relation Status  . Mother Alive   poor health ill with lymphedema  . Father Deceased at age 84   died from lung cancer  . Brother Deceased   complications from HIV  . Brother Deceased at age 14   died from a motor vehicle accident, had a MI while driving died from injury sustained in [place]  . Brother   . Brother         His family history includes Diabetes in his brother; Heart disease in his brother; Hypertension in his mother.     Allergies  Allergen Reactions  . Sulfa Antibiotics     Cause confusion     Current Outpatient Prescriptions:  .  amLODipine (NORVASC) 2.5 MG tablet, TAKE ONE (1) TABLET EACH DAY, Disp: 30 tablet, Rfl: 6 .  Cholecalciferol (VITAMIN D3) 2000 UNITS TABS, Take 5 tablets by mouth daily. , Disp: , Rfl:  .  Flaxseed, Linseed, (RA FLAX SEED OIL 1000 PO), Take 1 capsule by mouth daily., Disp: , Rfl:  .  irbesartan-hydrochlorothiazide (AVALIDE) 300-12.5 MG tablet, TAKE ONE (1) TABLET BY MOUTH EVERY DAY, Disp: 30 tablet, Rfl: 11 .  KRILL OIL PO, Take 100 mg by mouth daily. , Disp: , Rfl:  .  Omega-3 Fatty Acids (FISH OIL CONCENTRATE) 1000 MG CAPS, Take 1 capsule by mouth daily. , Disp: , Rfl:  .  omeprazole (PRILOSEC) 40 MG capsule, Take 40 mg by mouth daily. , Disp: , Rfl:  .  saw palmetto 500 MG capsule, Take 500 mg by mouth daily., Disp: , Rfl:  .  topiramate (TOPAMAX) 25 MG tablet, Take 1 tablet by mouth daily. , Disp: , Rfl:    Patient Care Team: Birdie Sons, MD as PCP - General (Family Medicine)      Objective:   Vitals: BP 110/60 (BP Location: Right Arm, Patient Position: Sitting, Cuff Size: Large)   Pulse 72   Temp 98.4 F (36.9 C) (Oral)   Resp 16   Ht 5\' 10"  (1.778 m)   Wt 223 lb (101.2 kg)   SpO2 94%   BMI 32.00 kg/m    Physical Exam   General Appearance:    Alert, cooperative, no distress, appears stated age  Head:    Normocephalic, without obvious abnormality, atraumatic  Eyes:    PERRL,  conjunctiva/corneas clear, EOM's intact, fundi    benign, both eyes       Ears:    Normal TM's and external ear canals, both ears  Nose:   Nares normal, septum midline, mucosa normal, no drainage   or sinus tenderness  Throat:   Lips, mucosa, and tongue normal; teeth and gums normal  Neck:   Supple, symmetrical, trachea midline, no adenopathy;       thyroid:  No enlargement/tenderness/nodules; no carotid   bruit or JVD  Back:     Symmetric, no curvature, ROM normal, no CVA tenderness  Lungs:     Clear to auscultation bilaterally, respirations unlabored  Chest wall:    No tenderness or deformity  Heart:    Regular rate and rhythm, S1 and S2 normal, no murmur, rub   or gallop  Abdomen:     Soft, non-tender, bowel sounds active all four quadrants,    no masses, no organomegaly  Genitalia:    deferred  Rectal:    deferred  Extremities:   Extremities normal, atraumatic, no cyanosis or edema  Pulses:   2+ and symmetric all extremities  Skin:   Skin color, texture, turgor normal, no rashes or lesions  Lymph nodes:   Cervical, supraclavicular, and axillary nodes normal  Neurologic:   CNII-XII intact. Normal strength, sensation and reflexes      throughout    Depression Screen PHQ 2/9 Scores 11/22/2016 07/13/2015  PHQ - 2 Score 0 0  PHQ- 9 Score 3 5      Assessment & Plan:     Routine Health Maintenance and Physical Exam  Exercise Activities and Dietary recommendations Goals    None      Immunization History  Administered Date(s) Administered  . Tdap 05/12/2012  . Zoster 07/13/2015    Health Maintenance  Topic Date Due  . HIV Screening  06/26/1970  . INFLUENZA VACCINE  05/31/2016  . COLONOSCOPY  12/02/2017  . TETANUS/TDAP  05/12/2022  . ZOSTAVAX  Completed  . Hepatitis C Screening  Completed     Discussed health benefits of physical activity, and encouraged him to engage in regular exercise appropriate for his age and condition.     --------------------------------------------------------------------  1. Annual physical exam  - Comprehensive metabolic panel - CBC - Lipid panel - TSH  2. Essential (primary) hypertension Well controlled.  Continue current medications.    3. Need for influenza vaccination  - Flu Vaccine QUAD 36+ mos IM  4. Gastroesophageal reflux disease, esophagitis presence not specified Well controlled.    5. Obesity (BMI 30-39.9) Diet and exericise.   6. Vitamin D deficiency  - VITAMIN D 25 Hydroxy (Vit-D Deficiency, Fractures) - Magnesium  7. Pain in both lower extremities Is mainly in muscles of thighs and lower legs, not in knee or hip joints.  - CK (Creatine Kinase) - Ferritin  8. Prostate cancer screening  - PSA  9. H/O adenomatous polyp of colon Due for follow up colonoscopy with Dr. Tiffany Kocher.  - Ambulatory referral to Gastroenterology   Lelon Huh, MD  [facility]

## 2016-11-23 ENCOUNTER — Encounter: Payer: Self-pay | Admitting: Family Medicine

## 2016-11-23 DIAGNOSIS — R739 Hyperglycemia, unspecified: Secondary | ICD-10-CM | POA: Insufficient documentation

## 2016-11-23 LAB — CBC
Hematocrit: 42.4 % (ref 37.5–51.0)
Hemoglobin: 15 g/dL (ref 13.0–17.7)
MCH: 31.8 pg (ref 26.6–33.0)
MCHC: 35.4 g/dL (ref 31.5–35.7)
MCV: 90 fL (ref 79–97)
Platelets: 143 10*3/uL — ABNORMAL LOW (ref 150–379)
RBC: 4.72 x10E6/uL (ref 4.14–5.80)
RDW: 13.2 % (ref 12.3–15.4)
WBC: 7.1 10*3/uL (ref 3.4–10.8)

## 2016-11-23 LAB — COMPREHENSIVE METABOLIC PANEL
A/G RATIO: 2.6 — AB (ref 1.2–2.2)
ALBUMIN: 4.7 g/dL (ref 3.6–4.8)
ALK PHOS: 67 IU/L (ref 39–117)
ALT: 22 IU/L (ref 0–44)
AST: 14 IU/L (ref 0–40)
BILIRUBIN TOTAL: 1.2 mg/dL (ref 0.0–1.2)
BUN / CREAT RATIO: 17 (ref 10–24)
BUN: 15 mg/dL (ref 8–27)
CHLORIDE: 99 mmol/L (ref 96–106)
CO2: 27 mmol/L (ref 18–29)
Calcium: 9.5 mg/dL (ref 8.6–10.2)
Creatinine, Ser: 0.9 mg/dL (ref 0.76–1.27)
GFR calc non Af Amer: 92 mL/min/{1.73_m2} (ref >59)
GFR, EST AFRICAN AMERICAN: 106 mL/min/{1.73_m2} (ref >59)
Globulin, Total: 1.8 g/dL (ref 1.5–4.5)
Glucose: 183 mg/dL — ABNORMAL HIGH (ref 65–99)
POTASSIUM: 4 mmol/L (ref 3.5–5.2)
Sodium: 143 mmol/L (ref 134–144)
TOTAL PROTEIN: 6.5 g/dL (ref 6.0–8.5)

## 2016-11-23 LAB — LIPID PANEL
CHOLESTEROL TOTAL: 144 mg/dL (ref 100–199)
Chol/HDL Ratio: 4.5 ratio units (ref 0.0–5.0)
HDL: 32 mg/dL — ABNORMAL LOW (ref >39)
TRIGLYCERIDES: 553 mg/dL — AB (ref 0–149)

## 2016-11-23 LAB — FERRITIN: FERRITIN: 458 ng/mL — AB (ref 30–400)

## 2016-11-23 LAB — MAGNESIUM: MAGNESIUM: 1.9 mg/dL (ref 1.6–2.3)

## 2016-11-23 LAB — CK: Total CK: 89 U/L (ref 24–204)

## 2016-11-23 LAB — TSH: TSH: 1.17 u[IU]/mL (ref 0.450–4.500)

## 2016-11-23 LAB — VITAMIN D 25 HYDROXY (VIT D DEFICIENCY, FRACTURES): VIT D 25 HYDROXY: 95.1 ng/mL (ref 30.0–100.0)

## 2016-11-23 LAB — PSA: PROSTATE SPECIFIC AG, SERUM: 1 ng/mL (ref 0.0–4.0)

## 2016-11-25 ENCOUNTER — Telehealth: Payer: Self-pay

## 2016-11-25 NOTE — Telephone Encounter (Signed)
Patient notified and scheduled appt

## 2016-11-25 NOTE — Telephone Encounter (Signed)
Patient advised. Patient states he did not fast that morning and actually had a candy bar right before appointment. He though we were not checking lipids. Do you still want him to come back for A1C?-aa

## 2016-11-25 NOTE — Telephone Encounter (Signed)
-----   Message from Birdie Sons, MD sent at 11/23/2016  8:12 AM EST ----- Patients blood sugar is high and he has very high triglycerides. Need to check A1c. [facility] will not be able to add this. He can come into office sometime this week to get a1c checked.

## 2016-11-25 NOTE — Telephone Encounter (Signed)
yes

## 2016-11-25 NOTE — Telephone Encounter (Signed)
Patient asked for [place] to give him a call back in 10 minutes-aa

## 2016-11-29 ENCOUNTER — Ambulatory Visit (INDEPENDENT_AMBULATORY_CARE_PROVIDER_SITE_OTHER): Payer: 59 | Admitting: Family Medicine

## 2016-11-29 ENCOUNTER — Encounter: Payer: Self-pay | Admitting: Family Medicine

## 2016-11-29 VITALS — BP 130/82 | HR 67 | Temp 97.6°F | Resp 16 | Wt 224.0 lb

## 2016-11-29 DIAGNOSIS — R7303 Prediabetes: Secondary | ICD-10-CM

## 2016-11-29 DIAGNOSIS — E781 Pure hyperglyceridemia: Secondary | ICD-10-CM

## 2016-11-29 LAB — POCT GLYCOSYLATED HEMOGLOBIN (HGB A1C)
Est. average glucose Bld gHb Est-mCnc: 140
HEMOGLOBIN A1C: 6.5

## 2016-11-29 MED ORDER — METFORMIN HCL ER 500 MG PO TB24: 500.0000 mg | ORAL_TABLET | Freq: Every day | ORAL | 3 refills | Status: DC

## 2016-11-29 NOTE — Patient Instructions (Addendum)
   Avoid starchy foods such as corn, white potatoes, and white breads   Do not consume any sweets except for whole fruits   Do not consume any sweetened beverages such as sweet tea or soft drinks   Green leafy vegetables are good for you. Sweet potatoes are OK.    You do not need to limit lean meats such as poultry or fish. You can eat red meats in moderation.

## 2016-11-29 NOTE — Progress Notes (Signed)
Patient: Arthur Davis Male    DOB: 26-Aug-1956   61 y.o.   MRN: [id number] Visit Date: 11/29/2016  Today's Provider: Lelon Huh, MD   Chief Complaint  Patient presents with  . Hyperglycemia   Subjective:    HPI  Hyperglycemia, Follow-up:   Lab Results  Component Value Date   GLUCOSE 183 (H) 11/22/2016   GLUCOSE 110 (H) 02/23/2016   GLUCOSE 172 (H) 10/25/2012    Last seen 7 days ago. During that visit, labs were ordered and showed that his blood sugar was elevated. Patient was advised to follow up in the office to have his HgbA1C checked. Current symptoms include polyuria and visual disturbances and have been stable.  Weight trend: stable Prior visit with dietician: no Current diet: in general, a "healthy" diet   Current exercise: cardiovascular workout on exercise equipment  Pertinent Labs:    Component Value Date/Time   CHOL 144 11/22/2016 1513   TRIG 553 (HH) 11/22/2016 1513   CHOLHDL 4.5 11/22/2016 1513   CREATININE 0.90 11/22/2016 1513   CREATININE 1.41 (H) 10/25/2012 0424    Wt Readings from Last 3 Encounters:  11/22/16 223 lb (101.2 kg)  04/12/16 226 lb (102.5 kg)  04/05/16 228 lb (103.4 kg)       Family History  Problem Relation Age of Onset  . Hypertension Mother   . Diabetes Brother   . Heart disease Brother      Allergies  Allergen Reactions  . Sulfa Antibiotics     Cause confusion     Current Outpatient Prescriptions:  .  amLODipine (NORVASC) 2.5 MG tablet, TAKE ONE (1) TABLET EACH DAY, Disp: 30 tablet, Rfl: 6 .  Cholecalciferol (VITAMIN D3) 2000 UNITS TABS, Take 5 tablets by mouth daily. , Disp: , Rfl:  .  Flaxseed, Linseed, (RA FLAX SEED OIL 1000 PO), Take 1 capsule by mouth daily., Disp: , Rfl:  .  irbesartan-hydrochlorothiazide (AVALIDE) 300-12.5 MG tablet, TAKE ONE (1) TABLET BY MOUTH EVERY DAY, Disp: 30 tablet, Rfl: 11 .  KRILL OIL PO, Take 100 mg by mouth daily. , Disp: , Rfl:  .  Omega-3 Fatty Acids (FISH OIL  CONCENTRATE) 1000 MG CAPS, Take 1 capsule by mouth daily. , Disp: , Rfl:  .  omeprazole (PRILOSEC) 40 MG capsule, Take 40 mg by mouth daily. , Disp: , Rfl:  .  saw palmetto 500 MG capsule, Take 500 mg by mouth daily., Disp: , Rfl:  .  topiramate (TOPAMAX) 25 MG tablet, Take 1 tablet by mouth daily. , Disp: , Rfl:   Review of Systems  Constitutional: Negative for appetite change, chills and fever.  Eyes: Positive for visual disturbance (blurred vision).  Respiratory: Negative for chest tightness, shortness of breath and wheezing.   Cardiovascular: Negative for chest pain and palpitations.  Gastrointestinal: Negative for abdominal pain, nausea and vomiting.  Endocrine: Positive for polyuria.  Musculoskeletal: Positive for myalgias (leg pain).    Social History  Substance Use Topics  . Smoking status: Never Smoker  . Smokeless tobacco: Never Used  . Alcohol use 0.0 oz/week     Comment: rare   Objective:   BP 130/82 (BP Location: Left Arm, Patient Position: Sitting, Cuff Size: Large)   Pulse 67   Temp 97.6 F (36.4 C) (Oral)   Resp 16   Wt 224 lb (101.6 kg)   SpO2 96% Comment: room air  BMI 32.14 kg/m   Physical Exam  General appearance: alert, well developed,  well nourished, cooperative and in no distress Head: Normocephalic, without obvious abnormality, atraumatic Respiratory: Respirations even and unlabored, normal respiratory rate Extremities: No gross deformities Skin: Skin color, texture, turgor normal. No rashes seen  Psych: Appropriate mood and affect. Neurologic: Mental status: Alert, oriented to person, place, and time, thought content appropriate.  Results for orders placed or performed in visit on 11/29/16  POCT HgB A1C  Result Value Ref Range   Hemoglobin A1C 6.5    Est. average glucose Bld gHb Est-mCnc 140        Assessment & Plan:     1. Prediabetes- New diagnosis Counseled on low glycemic index diet, exercise, and weight loss for diabetes prevention.  Also discussed risk/benefit of metformin to delay or prevent diabetes. He plans on making lifestyle improvements, but would also like to try metformin.   - POCT HgB A1C - metFORMIN (GLUCOPHAGE XR) 500 MG 24 hr tablet; Take 1 tablet (500 mg total) by mouth daily with breakfast.  Dispense: 30 tablet; Refill: 3    2. Hyperglyceridemia, pure Expect improvement with lifestyle changes.   Return in about 3 months (around 02/27/2017).       Lelon Huh, MD  [facility]

## 2016-12-20 ENCOUNTER — Other Ambulatory Visit: Payer: Self-pay | Admitting: Family Medicine

## 2017-01-04 DIAGNOSIS — Z86010 Personal history of colonic polyps: Secondary | ICD-10-CM | POA: Diagnosis not present

## 2017-01-17 DIAGNOSIS — R51 Headache: Secondary | ICD-10-CM | POA: Diagnosis not present

## 2017-01-17 DIAGNOSIS — G3184 Mild cognitive impairment, so stated: Secondary | ICD-10-CM | POA: Diagnosis not present

## 2017-01-17 DIAGNOSIS — G4733 Obstructive sleep apnea (adult) (pediatric): Secondary | ICD-10-CM | POA: Diagnosis not present

## 2017-02-14 DIAGNOSIS — Z86010 Personal history of colonic polyps: Secondary | ICD-10-CM | POA: Diagnosis not present

## 2017-02-14 DIAGNOSIS — K640 First degree hemorrhoids: Secondary | ICD-10-CM | POA: Diagnosis not present

## 2017-02-14 DIAGNOSIS — D126 Benign neoplasm of colon, unspecified: Secondary | ICD-10-CM | POA: Diagnosis not present

## 2017-02-14 DIAGNOSIS — D122 Benign neoplasm of ascending colon: Secondary | ICD-10-CM | POA: Diagnosis not present

## 2017-02-14 DIAGNOSIS — Z1211 Encounter for screening for malignant neoplasm of colon: Secondary | ICD-10-CM | POA: Diagnosis not present

## 2017-02-14 DIAGNOSIS — D123 Benign neoplasm of transverse colon: Secondary | ICD-10-CM | POA: Diagnosis not present

## 2017-02-14 LAB — HM COLONOSCOPY

## 2017-02-28 ENCOUNTER — Ambulatory Visit (INDEPENDENT_AMBULATORY_CARE_PROVIDER_SITE_OTHER): Payer: 59 | Admitting: Family Medicine

## 2017-02-28 ENCOUNTER — Encounter: Payer: Self-pay | Admitting: Family Medicine

## 2017-02-28 VITALS — BP 124/78 | HR 59 | Temp 97.7°F | Resp 16 | Wt 210.0 lb

## 2017-02-28 DIAGNOSIS — I10 Essential (primary) hypertension: Secondary | ICD-10-CM | POA: Diagnosis not present

## 2017-02-28 DIAGNOSIS — R739 Hyperglycemia, unspecified: Secondary | ICD-10-CM | POA: Diagnosis not present

## 2017-02-28 DIAGNOSIS — R7303 Prediabetes: Secondary | ICD-10-CM | POA: Diagnosis not present

## 2017-02-28 LAB — POCT GLYCOSYLATED HEMOGLOBIN (HGB A1C)
Est. average glucose Bld gHb Est-mCnc: 105
Hemoglobin A1C: 5.3

## 2017-02-28 MED ORDER — METFORMIN HCL ER 500 MG PO TB24: 500.0000 mg | ORAL_TABLET | Freq: Every day | ORAL | 3 refills | Status: DC

## 2017-02-28 NOTE — Progress Notes (Signed)
Patient: Arthur Davis Male    DOB: 1956-07-19   61 y.o.   MRN: [id number] Visit Date: 02/28/2017  Today's Provider: Lelon Huh, MD   Chief Complaint  Patient presents with  . Hypertension    follow up  . Hyperlipidemia    follow up  . Hyperglycemia    follow up   Subjective:    HPI  Hypertension, follow-up:  BP Readings from Last 3 Encounters:  11/29/16 130/82  11/22/16 110/60  04/12/16 112/62    He was last seen for hypertension 3 months ago.  BP at that visit was 110/60. Management since that visit includes no changes. He reports good compliance with treatment. He is not having side effects.  He is exercising. He is not adherent to low salt diet.   Outside blood pressures are checked occasionally. He is experiencing none.  Patient denies chest pain.   Cardiovascular risk factors include advanced age (older than 17 for men, 41 for women), dyslipidemia, hypertension and male gender.  Use of agents associated with hypertension: none.     Weight trend: decreasing steadily Wt Readings from Last 3 Encounters:  11/29/16 224 lb (101.6 kg)  11/22/16 223 lb (101.2 kg)  04/12/16 226 lb (102.5 kg)    Current diet: in general, a "healthy" diet    ------------------------------------------------------------------------  Lipid/Cholesterol, Follow-up:   Last seen for this3 months ago.  Management changes since that visit include counseling patient on lifestyle changes. . Last Lipid Panel:    Component Value Date/Time   CHOL 144 11/22/2016 1513   TRIG 553 (HH) 11/22/2016 1513   HDL 32 (L) 11/22/2016 1513   CHOLHDL 4.5 11/22/2016 1513   [place] Comment 11/22/2016 1513    Risk factors for vascular disease include hypercholesterolemia and hypertension  He reports good compliance with treatment. He is not having side effects.  Current symptoms include none and have been stable. Weight trend: decreasing steadily Prior visit with dietician: no Current  diet: well balanced Current exercise: walking  Wt Readings from Last 3 Encounters:  11/29/16 224 lb (101.6 kg)  11/22/16 223 lb (101.2 kg)  04/12/16 226 lb (102.5 kg)    -------------------------------------------------------------------  Hyperglycemia, Follow-up:   Lab Results  Component Value Date   HGBA1C 6.5 11/29/2016   GLUCOSE 183 (H) 11/22/2016   GLUCOSE 110 (H) 02/23/2016   GLUCOSE 172 (H) 10/25/2012    Last seen for for this 3 months ago.  Management since then includes starting Metformin and counseling patient on low glycemic index diet, exercise and weight loss. Current symptoms include none and have been stable.  Weight trend: decreasing steadily Prior visit with dietician: no Current diet: in general, a "healthy" diet   Current exercise: walking  Pertinent Labs:    Component Value Date/Time   CHOL 144 11/22/2016 1513   TRIG 553 (HH) 11/22/2016 1513   CHOLHDL 4.5 11/22/2016 1513   CREATININE 0.90 11/22/2016 1513   CREATININE 1.41 (H) 10/25/2012 0424    Wt Readings from Last 3 Encounters:  11/29/16 224 lb (101.6 kg)  11/22/16 223 lb (101.2 kg)  04/12/16 226 lb (102.5 kg)       Allergies  Allergen Reactions  . Sulfa Antibiotics     Cause confusion     Current Outpatient Prescriptions:  .  amLODipine (NORVASC) 2.5 MG tablet, TAKE ONE (1) TABLET EACH DAY, Disp: 30 tablet, Rfl: 6 .  Cholecalciferol (VITAMIN D3) 2000 UNITS TABS, Take 5 tablets by mouth daily. ,  Disp: , Rfl:  .  Flaxseed, Linseed, (RA FLAX SEED OIL 1000 PO), Take 1 capsule by mouth daily., Disp: , Rfl:  .  irbesartan-hydrochlorothiazide (AVALIDE) 300-12.5 MG tablet, TAKE ONE (1) TABLET BY MOUTH EVERY DAY, Disp: 30 tablet, Rfl: 5 .  KRILL OIL PO, Take 100 mg by mouth daily. , Disp: , Rfl:  .  metFORMIN (GLUCOPHAGE XR) 500 MG 24 hr tablet, Take 1 tablet (500 mg total) by mouth daily with breakfast., Disp: 30 tablet, Rfl: 3 .  Omega-3 Fatty Acids (FISH OIL CONCENTRATE) 1000 MG CAPS,  Take 1 capsule by mouth daily. , Disp: , Rfl:  .  omeprazole (PRILOSEC) 40 MG capsule, Take 40 mg by mouth daily. , Disp: , Rfl:  .  saw palmetto 500 MG capsule, Take 500 mg by mouth daily., Disp: , Rfl:  .  topiramate (TOPAMAX) 25 MG tablet, Take 1 tablet by mouth daily. , Disp: , Rfl:   Review of Systems  Constitutional: Negative for appetite change, chills and fever.  Respiratory: Negative for chest tightness, shortness of breath and wheezing.   Cardiovascular: Negative for chest pain and palpitations.  Gastrointestinal: Negative for abdominal pain, nausea and vomiting.  Musculoskeletal: Positive for back pain.  Neurological: Positive for numbness (in right leg).    Social History  Substance Use Topics  . Smoking status: Never Smoker  . Smokeless tobacco: Never Used  . Alcohol use 0.0 oz/week     Comment: rare   Objective:   BP 124/78 (BP Location: Left Arm, Patient Position: Sitting, Cuff Size: Large)   Pulse (!) 59   Temp 97.7 F (36.5 C) (Oral)   Resp 16   Wt 210 lb (95.3 kg)   SpO2 98%   BMI 30.13 kg/m  There were no vitals filed for this visit.   Physical Exam   General Appearance:    Alert, cooperative, no distress  Eyes:    PERRL, conjunctiva/corneas clear, EOM's intact       Lungs:     Clear to auscultation bilaterally, respirations unlabored  Heart:    Regular rate and rhythm  Neurologic:   Awake, alert, oriented x 3. No apparent focal neurological           defect.       Results for orders placed or performed in visit on 02/28/17  POCT HgB A1C  Result Value Ref Range   Hemoglobin A1C 5.3    Est. average glucose Bld gHb Est-mCnc 105         Assessment & Plan:     1. Hyperglycemia  - POCT HgB A1C  2. Essential (primary) hypertension Well controlled.  Continue current medications.    3. Prediabetes Well controlled with lifestyle changes and initiation of metformin. Continue current medications.  If a1c remains stable will consider  discontinuation of metformin.  - metFORMIN (GLUCOPHAGE XR) 500 MG 24 hr tablet; Take 1 tablet (500 mg total) by mouth daily with breakfast.  Dispense: 30 tablet; Refill: 3  Return in about 4 months (around 07/01/2017).     The entirety of the information documented in the History of Present Illness, Review of Systems and Physical Exam were personally obtained by me. Portions of this information were initially documented by Meyer Cory, CMA and reviewed by me for thoroughness and accuracy.   Lelon Huh, MD  [facility]

## 2017-03-07 ENCOUNTER — Encounter: Payer: Self-pay | Admitting: Family Medicine

## 2017-03-20 ENCOUNTER — Telehealth: Payer: Self-pay | Admitting: Family Medicine

## 2017-03-20 ENCOUNTER — Encounter: Payer: Self-pay | Admitting: Family Medicine

## 2017-03-20 ENCOUNTER — Ambulatory Visit (INDEPENDENT_AMBULATORY_CARE_PROVIDER_SITE_OTHER): Payer: 59 | Admitting: Family Medicine

## 2017-03-20 VITALS — BP 108/68 | HR 64 | Temp 98.3°F | Resp 16 | Wt 200.0 lb

## 2017-03-20 DIAGNOSIS — M791 Myalgia, unspecified site: Secondary | ICD-10-CM

## 2017-03-20 DIAGNOSIS — R5383 Other fatigue: Secondary | ICD-10-CM | POA: Diagnosis not present

## 2017-03-20 NOTE — Progress Notes (Signed)
Patient: Arthur Davis Male    DOB: Apr 11, 1956   61 y.o.   MRN: [id number] Visit Date: 03/20/2017  Today's Provider: Lelon Huh, MD   Chief Complaint  Patient presents with  . Fatigue    Extreme fatigue in the last two weeks.   . Muscle Pain    Worsening in the last month.   Subjective:    Muscle Pain  This is a recurrent problem. The current episode started more than 1 month ago. The problem occurs constantly. The problem has been gradually worsening since onset. The context of the pain is unknown. Pain location: Pt says the pain in all over but mostly in his legs and arms. Nothing aggravates the symptoms. Associated symptoms include constipation, diarrhea (Pt says had "Black stool" in the past week.  Had a colonscopy about 6 weeks ago.  Two polyps removed. ) and fatigue. Pertinent negatives include no abdominal pain, chest pain, fever, headaches, joint swelling, nausea, vomiting or weakness. Past treatments include heat pack. He has been sleeping poorly and sleeping more.  States he has felt extremely fatigued and tired. Muscle pains are keeping him awake at night. The episode of black stool only occurred once about 3 days ago. Has since had normal colored stools.      Allergies  Allergen Reactions  . Sulfa Antibiotics     Cause confusion     Current Outpatient Prescriptions:  .  amLODipine (NORVASC) 2.5 MG tablet, TAKE ONE (1) TABLET EACH DAY, Disp: 30 tablet, Rfl: 6 .  Cholecalciferol (VITAMIN D3) 2000 UNITS TABS, Take 5 tablets by mouth daily. , Disp: , Rfl:  .  Flaxseed, Linseed, (RA FLAX SEED OIL 1000 PO), Take 1 capsule by mouth daily., Disp: , Rfl:  .  irbesartan-hydrochlorothiazide (AVALIDE) 300-12.5 MG tablet, TAKE ONE (1) TABLET BY MOUTH EVERY DAY, Disp: 30 tablet, Rfl: 5 .  KRILL OIL PO, Take 100 mg by mouth daily. , Disp: , Rfl:  .  metFORMIN (GLUCOPHAGE XR) 500 MG 24 hr tablet, Take 1 tablet (500 mg total) by mouth daily with breakfast., Disp: 30 tablet,  Rfl: 3 .  Omega-3 Fatty Acids (FISH OIL CONCENTRATE) 1000 MG CAPS, Take 1 capsule by mouth daily. , Disp: , Rfl:  .  omeprazole (PRILOSEC) 40 MG capsule, Take 40 mg by mouth daily. , Disp: , Rfl:  .  saw palmetto 500 MG capsule, Take 500 mg by mouth daily., Disp: , Rfl:  .  topiramate (TOPAMAX) 25 MG tablet, Take 1 tablet by mouth daily. , Disp: , Rfl:   Review of Systems  Constitutional: Positive for fatigue. Negative for activity change, appetite change, chills, diaphoresis, fever and unexpected weight change (Pt has lost 25 pounds in the last six months.  ).  Respiratory: Negative.   Cardiovascular: Negative.  Negative for chest pain.  Gastrointestinal: Positive for constipation and diarrhea (Pt says had "Black stool" in the past week.  Had a colonscopy about 6 weeks ago.  Two polyps removed. ). Negative for abdominal distention, abdominal pain, anal bleeding, blood in stool, nausea, rectal pain and vomiting.  Musculoskeletal: Positive for arthralgias (Hip pain in the last day or so), back pain and myalgias. Negative for gait problem, joint swelling, neck pain and neck stiffness.  Neurological: Positive for speech difficulty (Pt says he was not able to pronouce several words that eventually resolved. ), light-headedness and numbness. Negative for dizziness, tremors, seizures, weakness and headaches.    Social History  Substance  Use Topics  . Smoking status: Never Smoker  . Smokeless tobacco: Never Used  . Alcohol use 0.0 oz/week     Comment: rare   Objective:   BP 108/68 (BP Location: Left Arm, Patient Position: Sitting, Cuff Size: Large)   Pulse 64   Temp 98.3 F (36.8 C) (Oral)   Resp 16   Wt 200 lb (90.7 kg)   BMI 28.70 kg/m     Physical Exam   General Appearance:    Alert, cooperative, no distress  Eyes:    PERRL, conjunctiva/corneas clear, EOM's intact       Lungs:     Clear to auscultation bilaterally, respirations unlabored  Heart:    Regular rate and rhythm    Neurologic:   Awake, alert, oriented x 3. No apparent focal neurological           defect.             Assessment & Plan:     1. Fatigue, unspecified type  - Comprehensive metabolic panel - CBC - T4 AND TSH - CK (Creatine Kinase) - Sed Rate (ESR) - EKG 12-Lead  2. Muscle pain  - Comprehensive metabolic panel - CBC - T4 AND TSH - CK (Creatine Kinase) - Sed Rate (ESR) - EKG 12-Lead       Lelon Huh, MD  [facility]

## 2017-03-20 NOTE — Telephone Encounter (Signed)
Pt was in this morning for fatigue.  She also had an EKG.  He wanted to add that when he has these fatigue spells that he feels ea heaviness in his chest.  Spoke to a nurse and she said since he had the EKG it was ok to send message back  [place]

## 2017-03-21 ENCOUNTER — Telehealth: Payer: Self-pay

## 2017-03-21 DIAGNOSIS — R0789 Other chest pain: Secondary | ICD-10-CM

## 2017-03-21 LAB — COMPREHENSIVE METABOLIC PANEL
ALT: 22 IU/L (ref 0–44)
AST: 20 IU/L (ref 0–40)
Albumin/Globulin Ratio: 2.6 — ABNORMAL HIGH (ref 1.2–2.2)
Albumin: 5 g/dL — ABNORMAL HIGH (ref 3.6–4.8)
Alkaline Phosphatase: 63 IU/L (ref 39–117)
BILIRUBIN TOTAL: 1.2 mg/dL (ref 0.0–1.2)
BUN/Creatinine Ratio: 17 (ref 10–24)
BUN: 19 mg/dL (ref 8–27)
CALCIUM: 9.2 mg/dL (ref 8.6–10.2)
CHLORIDE: 103 mmol/L (ref 96–106)
CO2: 24 mmol/L (ref 18–29)
CREATININE: 1.14 mg/dL (ref 0.76–1.27)
GFR calc Af Amer: 80 mL/min/{1.73_m2} (ref >59)
GFR calc non Af Amer: 69 mL/min/{1.73_m2} (ref >59)
GLUCOSE: 110 mg/dL — AB (ref 65–99)
Globulin, Total: 1.9 g/dL (ref 1.5–4.5)
Potassium: 4.1 mmol/L (ref 3.5–5.2)
Sodium: 140 mmol/L (ref 134–144)
TOTAL PROTEIN: 6.9 g/dL (ref 6.0–8.5)

## 2017-03-21 LAB — CBC
HEMATOCRIT: 46.1 % (ref 37.5–51.0)
HEMOGLOBIN: 17 g/dL (ref 13.0–17.7)
MCH: 32.5 pg (ref 26.6–33.0)
MCHC: 36.9 g/dL — ABNORMAL HIGH (ref 31.5–35.7)
MCV: 88 fL (ref 79–97)
Platelets: 158 10*3/uL (ref 150–379)
RBC: 5.23 x10E6/uL (ref 4.14–5.80)
RDW: 12.9 % (ref 12.3–15.4)
WBC: 6.6 10*3/uL (ref 3.4–10.8)

## 2017-03-21 LAB — CK: Total CK: 155 U/L (ref 24–204)

## 2017-03-21 LAB — T4 AND TSH
T4 TOTAL: 8.6 ug/dL (ref 4.5–12.0)
TSH: 1.04 u[IU]/mL (ref 0.450–4.500)

## 2017-03-21 LAB — SEDIMENTATION RATE: SED RATE: 2 mm/h (ref 0–30)

## 2017-03-21 NOTE — Telephone Encounter (Signed)
-----   Message from Birdie Sons, MD sent at 03/21/2017  9:48 AM EDT ----- Labs are all normal. Recommend referral to cariology since he has been having pressure in his chest.

## 2017-03-21 NOTE — Telephone Encounter (Signed)
Please see result note 

## 2017-03-22 ENCOUNTER — Telehealth: Payer: Self-pay

## 2017-03-22 NOTE — Telephone Encounter (Signed)
New patient referral for Chest Pressure scheduled next available in July added to waitlist.

## 2017-03-22 NOTE — Telephone Encounter (Signed)
Added to my wait list, as well.

## 2017-04-05 ENCOUNTER — Telehealth: Payer: Self-pay | Admitting: Family Medicine

## 2017-04-05 ENCOUNTER — Encounter: Payer: 59 | Admitting: Family Medicine

## 2017-04-05 NOTE — Telephone Encounter (Signed)
Please advise 

## 2017-04-05 NOTE — Telephone Encounter (Signed)
Vitamin b-12 deficiency is common for people that take more than 1000mg  a day of metformin. Vitamin b-12 deficiency can cause fatigue, numbness of hands and feet and sometimes balance problems. He probably doesn't need large doses of vitamin B12 since he is only taking 500mg  of metformin.   However, I would recommend taking a B-Complex vitamin, which has a little bit of all the B-vitamins, including B12. This usually helps with metabolism and energy.

## 2017-04-05 NOTE — Telephone Encounter (Signed)
Patient was notified.

## 2017-04-05 NOTE — Telephone Encounter (Signed)
Pt is asking if he would benefit from taking over the counter Vitamin B12 due to taking metFORMIN.  Please advise. CB#[phone]/MW

## 2017-05-06 NOTE — Progress Notes (Signed)
Cardiology Office Note  Date:  05/08/2017   ID:  Arthur Davis, Arthur Davis [birth year], MRN [id number]  PCP:  Birdie Sons, MD   Chief Complaint  Patient presents with  . other    C/o chest pressure/heaviness when fatigue. Meds reviewed verbally with pt.    HPI:  Arthur Davis Is a pleasant 61 year old gentleman with history of Adult ADHD, mild cognitive disorder followed by neurology Pre-Diabetes on half dose metformin HTN OSA on CPAP Who presents by referral from Dr. Caryn Section for consultation of his Chest pressure and fatigue  He reports long-standing history of Fatigue and chest heaviness  Details are unclear, possibly Better recently Not associated with exertion Describes any sensation as a dull pushing on his chest when he is tired Reports that his sleep is typically fine, wears his CPAP  No regular exercise program Recently started On B12 for energy  He reports weight loss over the past year, approximately 20 pounds Eating better, Because of this he has required less blood pressure medication and he has been cutting some of his doses down  EKG personally reviewed by myself on todays visit Shows normal sinus rhythm with rate 61 bpm no significant ST or T-wave changes   PMH:   has a past medical history of History of colonic polyps (05/09/2006); Hypertension (03/20/2000); and Pre-diabetes.  PSH:    Past Surgical History:  Procedure Laterality Date  . APPENDECTOMY  1960  . BRAIN SURGERY  1960's   Removed pressure from brain caused by head injury  . nasal abscess  1960   no information provider  . TONSILLECTOMY AND ADENOIDECTOMY  1960  . UPPER GI ENDOSCOPY  02/20/12   [facility], Normal Esophagus, Nornal Stomach, Normal duodenum, Dr. Candace Cruise; Dilated for dysphagia  . UPPER GI ENDOSCOPY  09/16/2013   Dr. Tiffany Kocher; Changes suspicious for eosinophilic esophaigitis. Normal stomach and duodenum  . VASECTOMY  1990    Current Outpatient Prescriptions  Medication Sig Dispense Refill  .  amLODipine (NORVASC) 2.5 MG tablet TAKE ONE (1) TABLET EACH DAY 30 tablet 6  . Cholecalciferol (VITAMIN D3) 2000 UNITS TABS Take 5 tablets by mouth daily.     . Flaxseed, Linseed, (RA FLAX SEED OIL 1000 PO) Take 1 capsule by mouth daily.    . irbesartan-hydrochlorothiazide (AVALIDE) 300-12.5 MG tablet TAKE ONE (1) TABLET BY MOUTH EVERY DAY 30 tablet 5  . KRILL OIL PO Take 100 mg by mouth daily.     . metFORMIN (GLUCOPHAGE XR) 500 MG 24 hr tablet Take 1 tablet (500 mg total) by mouth daily with breakfast. 30 tablet 3  . Omega-3 Fatty Acids (FISH OIL CONCENTRATE) 1000 MG CAPS Take 1 capsule by mouth daily.     Marland Kitchen omeprazole (PRILOSEC) 40 MG capsule Take 40 mg by mouth daily.     . saw palmetto 500 MG capsule Take 500 mg by mouth daily.    Marland Kitchen topiramate (TOPAMAX) 25 MG tablet Take 1 tablet by mouth daily.      No current facility-administered medications for this visit.      Allergies:   Sulfa antibiotics   Social History:  The patient  reports that he has never smoked. He has never used smokeless tobacco. He reports that he drinks alcohol. He reports that he does not use drugs.   Family History:   family history includes Diabetes in his brother; Heart attack in his brother; Heart disease in his brother; Hypertension in his mother.    Review of Systems: Review of  Systems  Constitutional: Positive for malaise/fatigue.  Respiratory: Negative.   Cardiovascular: Negative.        Chest pressure  Gastrointestinal: Negative.   Musculoskeletal: Negative.   Neurological: Negative.   Psychiatric/Behavioral: Negative.   All other systems reviewed and are negative.    PHYSICAL EXAM: VS:  BP 123/75 (BP Location: Right Arm, Patient Position: Sitting, Cuff Size: Normal)   Pulse 61   Ht 5\' 10"  (1.778 m)   Wt 201 lb 8 oz (91.4 kg)   BMI 28.91 kg/m  , BMI Body mass index is 28.91 kg/m. GEN: Well nourished, well developed, in no acute distress  HEENT: normal  Neck: no JVD, carotid bruits, or  masses Cardiac: RRR; no murmurs, rubs, or gallops,no edema  Respiratory:  clear to auscultation bilaterally, normal work of breathing GI: soft, nontender, nondistended, + BS MS: no deformity or atrophy  Skin: warm and dry, no rash Neuro:  Strength and sensation are intact Psych: euthymic mood, full affect   Recent Labs: 11/22/2016: Magnesium 1.9 03/20/2017: ALT 22; BUN 19; Creatinine, Ser 1.14; Hemoglobin 17.0; Platelets 158; Potassium 4.1; Sodium 140; TSH 1.040    Lipid Panel Lab Results  Component Value Date   CHOL 144 11/22/2016   HDL 32 (L) 11/22/2016   LDLCALC Comment 11/22/2016   TRIG 553 (HH) 11/22/2016      Wt Readings from Last 3 Encounters:  05/08/17 201 lb 8 oz (91.4 kg)  03/20/17 200 lb (90.7 kg)  02/28/17 210 lb (95.3 kg)       ASSESSMENT AND PLAN:  Essential (primary) hypertension - Plan: EKG 12-Lead, CT CARDIAC SCORING Blood pressure is well controlled on today's visit. No changes made to the medications. Recommended he monitor blood pressure especially when he stands if he has additional weight loss. May need to decrease the doses of his medications for any orthostasis symptoms  Hyperglyceridemia, pure - Plan: EKG 12-Lead, CT CARDIAC SCORING He feels sugars should be improved with recent weight loss He has been eating better. Taking only one half metoprolol pill  Obstructive sleep apnea - Plan: EKG 12-Lead, CT CARDIAC SCORING Reports he is compliant with his CPAP, sleeping well, should not be contributing to his fatigue  Obesity (BMI 30-39.9) - Plan: EKG 12-Lead, CT CARDIAC SCORING We have encouraged continued exercise, careful diet management in an effort to lose weight.  Chronic fatigue - Plan: CT CARDIAC SCORING Etiology unclear, still at work, no regular exercise program Grossly no abnormalities noted  Chest pressure Cardiac workup discussed with him Essentially nondiabetic, no smoking with good cholesterol on no medication Various studies  discussed with him for risk stratification Recommended CT coronary calcium score If he has worsening symptoms stress testing could be ordered  Disposition:   F/U as needed We will call him with the results of his CT coronary calcium score  Patient was seen for Dr. Caryn Section in consultation for his chest pressure and fatigue and will be referred back to Dr. Caryn Section for ongoing care of the issues detailed above   Orders Placed This Encounter  Procedures  . CT CARDIAC SCORING  . EKG 12-Lead     Signed, Esmond Plants, M.D., Ph.D. 05/08/2017  [place], [place]

## 2017-05-08 ENCOUNTER — Ambulatory Visit: Payer: 59 | Admitting: Cardiovascular Disease

## 2017-05-08 ENCOUNTER — Ambulatory Visit (INDEPENDENT_AMBULATORY_CARE_PROVIDER_SITE_OTHER): Payer: 59 | Admitting: Cardiovascular Disease

## 2017-05-08 ENCOUNTER — Encounter: Payer: Self-pay | Admitting: Cardiovascular Disease

## 2017-05-08 VITALS — BP 123/75 | HR 61 | Ht 70.0 in | Wt 201.5 lb

## 2017-05-08 DIAGNOSIS — G4733 Obstructive sleep apnea (adult) (pediatric): Secondary | ICD-10-CM

## 2017-05-08 DIAGNOSIS — R5382 Chronic fatigue, unspecified: Secondary | ICD-10-CM

## 2017-05-08 DIAGNOSIS — E781 Pure hyperglyceridemia: Secondary | ICD-10-CM

## 2017-05-08 DIAGNOSIS — R0789 Other chest pain: Secondary | ICD-10-CM | POA: Diagnosis not present

## 2017-05-08 DIAGNOSIS — I10 Essential (primary) hypertension: Secondary | ICD-10-CM

## 2017-05-08 DIAGNOSIS — E669 Obesity, unspecified: Secondary | ICD-10-CM

## 2017-05-08 NOTE — Patient Instructions (Addendum)
Medication Instructions:   No medication changes made  Labwork:  No new labs needed  Testing/Procedures:  We will order a CT coronary calcium score For chest pain, family hx Please call [phone] to schedule this at your convenience [address] There is a one-time fee of $150 due at the time of your procedure    Follow-Up: It was a pleasure seeing you in the office today. Please call us if you have new issues that need to be addressed before your next appt.  [phone]  Your physician wants you to follow-up in:  As needed  If you need a refill on your cardiac medications before your next appointment, please call your pharmacy.     Coronary Calcium Scan A coronary calcium scan is an imaging test used to look for deposits of calcium and other fatty materials (plaques) in the inner lining of the blood vessels of the heart (coronary arteries). These deposits of calcium and plaques can partly clog and narrow the coronary arteries without producing any symptoms or warning signs. This puts a person at risk for a heart attack. This test can detect these deposits before symptoms develop. Tell a health care provider about:  Any allergies you have.  All medicines you are taking, including vitamins, herbs, eye drops, creams, and over-the-counter medicines.  Any problems you or family members have had with anesthetic medicines.  Any blood disorders you have.  Any surgeries you have had.  Any medical conditions you have.  Whether you are pregnant or may be pregnant. What are the risks? Generally, this is a safe procedure. However, problems may occur, including:  Harm to a pregnant woman and her unborn baby. This test involves the use of radiation. Radiation exposure can be dangerous to a pregnant woman and her unborn baby. If you are pregnant, you generally should not have this procedure done.  Slight increase in the risk of cancer. This is  because of the radiation involved in the test.  What happens before the procedure? No preparation is needed for this procedure. What happens during the procedure?  You will undress and remove any jewelry around your neck or chest.  You will put on a hospital gown.  Sticky electrodes will be placed on your chest. The electrodes will be connected to an electrocardiogram (ECG) machine to record a tracing of the electrical activity of your heart.  A CT scanner will take pictures of your heart. During this time, you will be asked to lie still and hold your breath for 2-3 seconds while a picture of your heart is being taken. The procedure may vary among health care providers and hospitals. What happens after the procedure?  You can get dressed.  You can return to your normal activities.  It is up to you to get the results of your test. Ask your health care provider, or the department that is doing the test, when your results will be ready. Summary  A coronary calcium scan is an imaging test used to look for deposits of calcium and other fatty materials (plaques) in the inner lining of the blood vessels of the heart (coronary arteries).  Generally, this is a safe procedure. Tell your health care provider if you are pregnant or may be pregnant.  No preparation is needed for this procedure.  A CT scanner will take pictures of your heart.  You can return to your normal activities after the scan is done. This information is not intended  to replace advice given to you by your health care provider. Make sure you discuss any questions you have with your health care provider. Document Released: 04/14/2008 Document Revised: 09/05/2016 Document Reviewed: 09/05/2016 Elsevier Interactive Patient Education  2017 [employer].

## 2017-06-28 ENCOUNTER — Other Ambulatory Visit: Payer: Self-pay | Admitting: Family Medicine

## 2017-06-28 DIAGNOSIS — I10 Essential (primary) hypertension: Secondary | ICD-10-CM

## 2017-07-05 ENCOUNTER — Ambulatory Visit (INDEPENDENT_AMBULATORY_CARE_PROVIDER_SITE_OTHER): Payer: 59 | Admitting: Family Medicine

## 2017-07-05 ENCOUNTER — Encounter: Payer: Self-pay | Admitting: Family Medicine

## 2017-07-05 VITALS — BP 100/64 | HR 54 | Temp 97.7°F | Resp 16 | Ht 70.0 in | Wt 194.0 lb

## 2017-07-05 DIAGNOSIS — R7303 Prediabetes: Secondary | ICD-10-CM | POA: Diagnosis not present

## 2017-07-05 DIAGNOSIS — R739 Hyperglycemia, unspecified: Secondary | ICD-10-CM | POA: Diagnosis not present

## 2017-07-05 DIAGNOSIS — Z23 Encounter for immunization: Secondary | ICD-10-CM

## 2017-07-05 LAB — POCT GLYCOSYLATED HEMOGLOBIN (HGB A1C)
ESTIMATED AVERAGE GLUCOSE: 103
HEMOGLOBIN A1C: 5.2

## 2017-07-05 MED ORDER — METFORMIN HCL ER 500 MG PO TB24: ORAL_TABLET | ORAL | 3 refills | Status: DC

## 2017-07-05 NOTE — Progress Notes (Signed)
Patient: Arthur Davis Male    DOB: August 12, 1956   61 y.o.   MRN: [id number] Visit Date: 07/05/2017  Today's Provider: Lelon Huh, MD   Chief Complaint  Patient presents with  . Follow-up  . Hypertension  . Hyperglycemia   Subjective:    HPI   Hypertension, follow-up:  BP Readings from Last 3 Encounters:  07/05/17 100/64  05/08/17 123/75  03/20/17 108/68    He was last seen for hypertension 3 months ago.  BP at that visit was 124/78. Management since that visit includes; no changes.He reports good compliance with treatment. He is not having side effects. none He is exercising. He is adherent to low salt diet.   Outside blood pressures are normal. He is experiencing none.  Patient denies none.   Cardiovascular risk factors include none.  Use of agents associated with hypertension: none.   ----------------------------------------------------------------  Hyperglycemia From 02/28/2017-POCT HgB A1C was 5.3  Prediabetes From 02/28/2017-Well controlled with lifestyle changes and initiation of metformin. Continue current medications.  If a1c remains stable will consider discontinuation of metformin.   Wt Readings from Last 3 Encounters:  07/05/17 194 lb (88 kg)  05/08/17 201 lb 8 oz (91.4 kg)  03/20/17 200 lb (90.7 kg)    Allergies  Allergen Reactions  . Sulfa Antibiotics     Cause confusion     Current Outpatient Prescriptions:  .  amLODipine (NORVASC) 2.5 MG tablet, TAKE ONE TABLET BY MOUTH EVERY DAY, Disp: 30 tablet, Rfl: 12 .  Cholecalciferol (VITAMIN D3) 2000 UNITS TABS, Take 5 tablets by mouth daily. , Disp: , Rfl:  .  Flaxseed, Linseed, (RA FLAX SEED OIL 1000 PO), Take 1 capsule by mouth daily., Disp: , Rfl:  .  irbesartan-hydrochlorothiazide (AVALIDE) 300-12.5 MG tablet, TAKE ONE (1) TABLET BY MOUTH EVERY DAY, Disp: 30 tablet, Rfl: 12 .  KRILL OIL PO, Take 100 mg by mouth daily. , Disp: , Rfl:  .  metFORMIN (GLUCOPHAGE XR) 500 MG 24 hr tablet,  Take 1 tablet (500 mg total) by mouth daily with breakfast., Disp: 30 tablet, Rfl: 3 .  Omega-3 Fatty Acids (FISH OIL CONCENTRATE) 1000 MG CAPS, Take 1 capsule by mouth daily. , Disp: , Rfl:  .  omeprazole (PRILOSEC) 40 MG capsule, Take 40 mg by mouth daily. , Disp: , Rfl:  .  saw palmetto 500 MG capsule, Take 500 mg by mouth daily., Disp: , Rfl:  .  topiramate (TOPAMAX) 25 MG tablet, Take 1 tablet by mouth daily. , Disp: , Rfl:   Review of Systems  Constitutional: Negative for appetite change, chills and fever.  Respiratory: Negative for chest tightness, shortness of breath and wheezing.   Cardiovascular: Negative for chest pain and palpitations.  Gastrointestinal: Negative for abdominal pain, nausea and vomiting.    Social History  Substance Use Topics  . Smoking status: Never Smoker  . Smokeless tobacco: Never Used  . Alcohol use 0.0 oz/week     Comment: rare   Objective:   BP 100/64 (BP Location: Right Arm, Patient Position: Sitting, Cuff Size: Large)   Pulse (!) 54   Temp 97.7 F (36.5 C) (Oral)   Resp 16   Ht 5\' 10"  (1.778 m)   Wt 194 lb (88 kg)   SpO2 98%   BMI 27.84 kg/m  Vitals:   07/05/17 0827  BP: 100/64  Pulse: (!) 54  Resp: 16  Temp: 97.7 F (36.5 C)  TempSrc: Oral  SpO2: 98%  Weight: 194 lb (88 kg)  Height: 5\' 10"  (1.778 m)     Physical Exam   General Appearance:    Alert, cooperative, no distress  Eyes:    PERRL, conjunctiva/corneas clear, EOM's intact       Lungs:     Clear to auscultation bilaterally, respirations unlabored  Heart:    Regular rate and rhythm  Neurologic:   Awake, alert, oriented x 3. No apparent focal neurological           defect.       Results for orders placed or performed in visit on 07/05/17  POCT glycosylated hemoglobin (Hb A1C)  Result Value Ref Range   Hemoglobin A1C 5.2    Est. average glucose Bld gHb Est-mCnc 103        Assessment & Plan:     1. Prediabetes Doing great with diet and exercise. Suggested  stopping metformin, but he is very anxious about developing diabetes. Will just reduce metformin to 1/2 tablet daily for the time being.  - POCT glycosylated hemoglobin (Hb A1C)  2. Need for influenza vaccination  - Flu Vaccine QUAD 36+ mos IM  3.  Hypertension Will d/c amlodipine for the time being and reassess at CPE in January.          Lelon Huh, MD  [facility]

## 2017-07-05 NOTE — Patient Instructions (Signed)
  Medications Discontinued During This Encounter  Medication Reason  . amLODipine (NORVASC) 2.5 MG tablet     Meds ordered this encounter  Medications  . metFORMIN (GLUCOPHAGE XR) 500 MG 24 hr tablet    Sig: 1/2 tablet daily.    Dispense:  30 tablet    Refill:  3    Adult vaccines due  Topic Date Due  . TETANUS/TDAP  05/12/2022    Health Maintenance Due  Topic Date Due  . HIV Screening  06/26/1970  . INFLUENZA VACCINE  05/31/2017

## 2017-09-15 ENCOUNTER — Encounter: Payer: Self-pay | Admitting: Family Medicine

## 2017-09-15 ENCOUNTER — Ambulatory Visit (INDEPENDENT_AMBULATORY_CARE_PROVIDER_SITE_OTHER): Payer: 59 | Admitting: Family Medicine

## 2017-09-15 VITALS — BP 132/68 | HR 72 | Temp 97.6°F | Resp 16 | Wt 203.0 lb

## 2017-09-15 DIAGNOSIS — M25562 Pain in left knee: Secondary | ICD-10-CM | POA: Diagnosis not present

## 2017-09-15 DIAGNOSIS — L739 Follicular disorder, unspecified: Secondary | ICD-10-CM

## 2017-09-15 MED ORDER — DOXYCYCLINE HYCLATE 100 MG PO TABS: 100.0000 mg | ORAL_TABLET | Freq: Two times a day (BID) | ORAL | 0 refills | Status: DC

## 2017-09-15 MED ORDER — NAPROXEN 500 MG PO TABS: 500.0000 mg | ORAL_TABLET | Freq: Two times a day (BID) | ORAL | 0 refills | Status: AC

## 2017-09-15 NOTE — Progress Notes (Addendum)
Patient: Arthur Davis Male    DOB: 10/07/1956   61 y.o.   MRN: [id number] Visit Date: 09/15/2017  Today's Provider: Lelon Huh, MD   Chief Complaint  Patient presents with  . Knee Pain   Subjective:    Knee Pain   There was no injury mechanism. The pain is present in the left knee. The quality of the pain is described as aching (tight swollen. feels like something is wrapped around it tight). The pain is at a severity of 4/10. He reports no foreign bodies present. The symptoms are aggravated by movement and weight bearing. He has tried nothing for the symptoms.  Pt reports that he had a meniscus tear in the affected knee about 7- 10 years ago. It was injection after the surgery and helped.   He has also developed a boil on the front of his left knee about 2 weeks ago which he popped, but it quickly returned.      Allergies  Allergen Reactions  . Sulfa Antibiotics     Cause confusion     Current Outpatient Medications:  .  Cholecalciferol (VITAMIN D3) 2000 UNITS TABS, Take 5 tablets by mouth daily. , Disp: , Rfl:  .  Flaxseed, Linseed, (RA FLAX SEED OIL 1000 PO), Take 1 capsule by mouth daily., Disp: , Rfl:  .  irbesartan-hydrochlorothiazide (AVALIDE) 300-12.5 MG tablet, TAKE ONE (1) TABLET BY MOUTH EVERY DAY (Patient taking differently: TAKE ONE 1/2 TABLET BY MOUTH EVERY DAY), Disp: 30 tablet, Rfl: 12 .  KRILL OIL PO, Take 100 mg by mouth daily. , Disp: , Rfl:  .  metFORMIN (GLUCOPHAGE XR) 500 MG 24 hr tablet, 1/2 tablet daily., Disp: 30 tablet, Rfl: 3 .  Omega-3 Fatty Acids (FISH OIL CONCENTRATE) 1000 MG CAPS, Take 1 capsule by mouth daily. , Disp: , Rfl:  .  omeprazole (PRILOSEC) 40 MG capsule, Take 40 mg by mouth daily. , Disp: , Rfl:  .  saw palmetto 500 MG capsule, Take 500 mg by mouth daily., Disp: , Rfl:  .  topiramate (TOPAMAX) 25 MG tablet, Take 1 tablet by mouth daily. , Disp: , Rfl:   Review of Systems  Constitutional: Negative for appetite change,  chills and fever.  Respiratory: Negative for chest tightness, shortness of breath and wheezing.   Cardiovascular: Negative for chest pain and palpitations.  Gastrointestinal: Negative for abdominal pain, nausea and vomiting.  Musculoskeletal: Positive for arthralgias.    Social History   Tobacco Use  . Smoking status: Never Smoker  . Smokeless tobacco: Never Used  Substance Use Topics  . Alcohol use: Yes    Alcohol/week: 0.0 oz    Comment: rare   Objective:   BP 132/68 (BP Location: Right Arm, Patient Position: Sitting, Cuff Size: Normal)   Pulse 72   Temp 97.6 F (36.4 C) (Oral)   Resp 16   Wt 203 lb (92.1 kg)   BMI 29.13 kg/m  Vitals:   09/15/17 1344  BP: 132/68  Pulse: 72  Resp: 16  Temp: 97.6 F (36.4 C)  TempSrc: Oral  Weight: 203 lb (92.1 kg)     Physical Exam  General appearance: alert, well developed, well nourished, cooperative and in no distress Head: Normocephalic, without obvious abnormality, atraumatic Respiratory: Respirations even and unlabored, normal respiratory rate Extremities: Small inflamed pustule anterior aspect of left knee with about 1/2 cm surrounding erythema. Left knee moderately swollen, tender along lateral and medial aspects. No erythema. Slightly pain  with flexion due to stiffness. Negative McMurray's sign.      Assessment & Plan:     1. Folliculitis  - doxycycline (VIBRA-TABS) 100 MG tablet; Take 1 tablet (100 mg total) 2 (two) times daily by mouth.  Dispense: 20 tablet; Refill: 0  2. Acute pain of left knee Consider orthopedics referral.  - naproxen (NAPROSYN) 500 MG tablet; Take 1 tablet (500 mg total) 2 (two) times daily with a meal for 10 days by mouth.  Dispense: 30 tablet; Refill: 0   Call if symptoms change or if not rapidly improving.          Lelon Huh, MD  [facility]

## 2017-10-10 DIAGNOSIS — G4733 Obstructive sleep apnea (adult) (pediatric): Secondary | ICD-10-CM | POA: Diagnosis not present

## 2017-10-10 DIAGNOSIS — G43019 Migraine without aura, intractable, without status migrainosus: Secondary | ICD-10-CM | POA: Diagnosis not present

## 2017-10-10 DIAGNOSIS — G3184 Mild cognitive impairment, so stated: Secondary | ICD-10-CM | POA: Diagnosis not present

## 2017-10-20 ENCOUNTER — Ambulatory Visit (INDEPENDENT_AMBULATORY_CARE_PROVIDER_SITE_OTHER): Payer: 59 | Admitting: Family Medicine

## 2017-10-20 ENCOUNTER — Encounter: Payer: Self-pay | Admitting: Family Medicine

## 2017-10-20 VITALS — BP 120/70 | HR 65 | Temp 97.8°F | Resp 16 | Wt 206.0 lb

## 2017-10-20 DIAGNOSIS — M25561 Pain in right knee: Secondary | ICD-10-CM | POA: Diagnosis not present

## 2017-10-20 DIAGNOSIS — G8929 Other chronic pain: Secondary | ICD-10-CM

## 2017-10-20 NOTE — Progress Notes (Signed)
Patient: Arthur Davis Male    DOB: May 10, 1956   61 y.o.   MRN: [id number] Visit Date: 10/20/2017  Today's Provider: Lelon Huh, MD   Chief Complaint  Patient presents with  . Knee Pain   Subjective:    Patient has had left knee pain for 2 months. Pain is on back of knee. Patient states knee pain just started one day out of the blue. Pain is constant and has gradually worsened. Pain is also worse when bending knee. Patient has tried elevation and head with mild relief.    Knee Pain   The incident occurred more than 1 week ago (2 months). There was no injury mechanism. The pain is present in the left knee. The quality of the pain is described as aching. The pain is at a severity of 7/10. The pain is moderate. The pain has been constant since onset. Associated symptoms include an inability to bear weight, a loss of motion and muscle weakness. Pertinent negatives include no loss of sensation, numbness or tingling. He reports no foreign bodies present. The symptoms are aggravated by movement and palpation. He has tried elevation and heat for the symptoms. The treatment provided mild relief.  he was seen for knee pain on 09/15/2017 at which time he had folliculitis on his knee and prescribed doxycycline and naproxen. He states infection has completely resolved, but remains medial aspect of knee remains painful and swollen. He has been applying heat which helps for a few hours.      Allergies  Allergen Reactions  . Sulfa Antibiotics     Cause confusion     Current Outpatient Medications:  .  Cholecalciferol (VITAMIN D3) 2000 UNITS TABS, Take 5 tablets by mouth daily. , Disp: , Rfl:  .  Flaxseed, Linseed, (RA FLAX SEED OIL 1000 PO), Take 1 capsule by mouth daily., Disp: , Rfl:  .  irbesartan-hydrochlorothiazide (AVALIDE) 300-12.5 MG tablet, TAKE ONE (1) TABLET BY MOUTH EVERY DAY (Patient taking differently: TAKE ONE 1/2 TABLET BY MOUTH EVERY DAY), Disp: 30 tablet, Rfl: 12 .   KRILL OIL PO, Take 100 mg by mouth daily. , Disp: , Rfl:  .  metFORMIN (GLUCOPHAGE XR) 500 MG 24 hr tablet, 1/2 tablet daily., Disp: 30 tablet, Rfl: 3 .  Omega-3 Fatty Acids (FISH OIL CONCENTRATE) 1000 MG CAPS, Take 1 capsule by mouth daily. , Disp: , Rfl:  .  omeprazole (PRILOSEC) 40 MG capsule, Take 40 mg by mouth daily. , Disp: , Rfl:  .  saw palmetto 500 MG capsule, Take 500 mg by mouth daily., Disp: , Rfl:  .  topiramate (TOPAMAX) 25 MG tablet, Take 1 tablet by mouth 2 (two) times daily. , Disp: , Rfl:  .  doxycycline (VIBRA-TABS) 100 MG tablet, Take 1 tablet (100 mg total) 2 (two) times daily by mouth. (Patient not taking: Reported on 10/20/2017), Disp: 20 tablet, Rfl: 0  Review of Systems  Constitutional: Negative for appetite change, chills and fever.  Respiratory: Negative for chest tightness, shortness of breath and wheezing.   Cardiovascular: Negative for chest pain and palpitations.  Gastrointestinal: Negative for abdominal pain, nausea and vomiting.  Musculoskeletal: Positive for arthralgias.  Neurological: Negative for tingling and numbness.    Social History   Tobacco Use  . Smoking status: Never Smoker  . Smokeless tobacco: Never Used  Substance Use Topics  . Alcohol use: Yes    Alcohol/week: 0.0 oz    Comment: rare   Objective:  BP 120/70 (BP Location: Right Arm, Patient Position: Sitting, Cuff Size: Large)   Pulse 65   Temp 97.8 F (36.6 C) (Oral)   Resp 16   Wt 206 lb (93.4 kg)   SpO2 97%   BMI 29.56 kg/m  Vitals:   10/20/17 1458  BP: 120/70  Pulse: 65  Resp: 16  Temp: 97.8 F (36.6 C)  TempSrc: Oral  SpO2: 97%  Weight: 206 lb (93.4 kg)     Physical Exam  General appearance: alert, well developed, well nourished, cooperative and in no distress Head: Normocephalic, without obvious abnormality, atraumatic Respiratory: Respirations even and unlabored, normal respiratory rate Extremities: Moderate swelling left knee. No erythema. No skin lesion.  Tender medially. Pain reproduced with valgus pressure. Negative mcMurray's sign.      Assessment & Plan:     1. Chronic pain of right knee Suspect MCL injury. Can continue naproxen prn. Alternate heat and ice until seen by orthopedics.  - Ambulatory referral to Orthopedic Surgery.        Lelon Huh, MD  [facility]

## 2017-10-30 DIAGNOSIS — M1712 Unilateral primary osteoarthritis, left knee: Secondary | ICD-10-CM | POA: Insufficient documentation

## 2017-11-08 ENCOUNTER — Ambulatory Visit
Admission: RE | Admit: 2017-11-08 | Discharge: 2017-11-08 | Disposition: A | Discharge status: Home / Self Care | Payer: Self-pay | Source: Ambulatory Visit | Attending: Cardiovascular Disease | Admitting: Cardiovascular Disease

## 2017-11-08 DIAGNOSIS — E781 Pure hyperglyceridemia: Secondary | ICD-10-CM

## 2017-11-08 DIAGNOSIS — I10 Essential (primary) hypertension: Secondary | ICD-10-CM

## 2017-11-08 DIAGNOSIS — G4733 Obstructive sleep apnea (adult) (pediatric): Secondary | ICD-10-CM

## 2017-11-08 DIAGNOSIS — E669 Obesity, unspecified: Secondary | ICD-10-CM

## 2017-11-08 DIAGNOSIS — R5382 Chronic fatigue, unspecified: Secondary | ICD-10-CM

## 2017-11-22 ENCOUNTER — Encounter: Payer: Self-pay | Admitting: Family Medicine

## 2017-11-22 ENCOUNTER — Other Ambulatory Visit: Payer: Self-pay

## 2017-11-22 ENCOUNTER — Ambulatory Visit (INDEPENDENT_AMBULATORY_CARE_PROVIDER_SITE_OTHER): Payer: 59 | Admitting: Family Medicine

## 2017-11-22 VITALS — BP 120/60 | HR 61 | Temp 98.3°F | Resp 16 | Ht 70.0 in | Wt 211.0 lb

## 2017-11-22 DIAGNOSIS — I10 Essential (primary) hypertension: Secondary | ICD-10-CM | POA: Diagnosis not present

## 2017-11-22 DIAGNOSIS — Z0000 Encounter for general adult medical examination without abnormal findings: Secondary | ICD-10-CM | POA: Diagnosis not present

## 2017-11-22 DIAGNOSIS — E781 Pure hyperglyceridemia: Secondary | ICD-10-CM

## 2017-11-22 DIAGNOSIS — R739 Hyperglycemia, unspecified: Secondary | ICD-10-CM | POA: Diagnosis not present

## 2017-11-22 DIAGNOSIS — Z125 Encounter for screening for malignant neoplasm of prostate: Secondary | ICD-10-CM | POA: Diagnosis not present

## 2017-11-22 DIAGNOSIS — E559 Vitamin D deficiency, unspecified: Secondary | ICD-10-CM

## 2017-11-22 NOTE — Patient Instructions (Signed)

## 2017-11-22 NOTE — Progress Notes (Signed)
Patient: Arthur Davis, Male    DOB: February 16, 1956, 62 y.o.   MRN: [id number] Visit Date: 11/22/2017  Today's Provider: Lelon Huh, MD   Chief Complaint  Patient presents with  . Annual Exam  . Hypertension  . Gastroesophageal Reflux  . Obesity   Subjective:    Annual physical exam Arthur Davis is a 62 y.o. male who presents today for health maintenance and complete physical. He feels well. He reports exercising yes. He reports he is sleeping fairly well.  -----------------------------------------------------------------   Hypertension, follow-up:  BP Readings from Last 3 Encounters:  11/22/17 120/60  10/20/17 120/70  09/15/17 132/68    He was last seen for hypertension 4 months ago.  BP at that visit was 100/64. Management since that visit includes; discontinued amlodipine for the time being and will reassess in January .He reports good compliance with treatment. He is not having side effects. none He is exercising. He is adherent to low salt diet.   Outside blood pressures are 125/65. He is experiencing none.  Patient denies none.   Cardiovascular risk factors include male gender.  Use of agents associated with hypertension: none.   ----------------------------------------------------------------   Gastroesophageal reflux disease, esophagitis presence not specified From 11/22/2016-no changes. Well controlled.    Prediabetes From 07/05/2017-reduced metformin to 1/2 tablet daily for the time being. Hemoglobin A1C 5.2.  Review of Systems  Constitutional: Negative for chills, diaphoresis and fever.  HENT: Negative for congestion, ear discharge, ear pain, hearing loss, nosebleeds, sore throat and tinnitus.   Eyes: Negative for photophobia, pain, discharge and redness.  Respiratory: Negative for cough, shortness of breath, wheezing and stridor.   Cardiovascular: Negative for chest pain, palpitations and leg swelling.  Gastrointestinal: Negative for  abdominal pain, blood in stool, constipation, diarrhea, nausea and vomiting.  Endocrine: Positive for polyuria. Negative for polydipsia.  Genitourinary: Positive for frequency. Negative for dysuria, flank pain, hematuria and urgency.  Musculoskeletal: Negative for back pain, myalgias and neck pain.  Skin: Negative for rash.  Allergic/Immunologic: Negative for environmental allergies.  Neurological: Negative for dizziness, tremors, seizures, weakness and headaches.  Hematological: Does not bruise/bleed easily.  Psychiatric/Behavioral: Negative for hallucinations and suicidal ideas. The patient is not nervous/anxious.   All other systems reviewed and are negative.   Social History      He  reports that  has never smoked. he has never used smokeless tobacco. He reports that he drinks alcohol. He reports that he does not use drugs.       Social History   Socioeconomic History  . Marital status: Married    Spouse name: None  . Number of children: 5  . Years of education: None  . Highest education level: None  Social Needs  . Financial resource strain: None  . Food insecurity - worry: None  . Food insecurity - inability: None  . Transportation needs - medical: None  . Transportation needs - non-medical: None  Occupational History  . Occupation: HVAC work   Tobacco Use  . Smoking status: Never Smoker  . Smokeless tobacco: Never Used  Substance and Sexual Activity  . Alcohol use: Yes    Alcohol/week: 0.0 oz    Comment: rare  . Drug use: No  . Sexual activity: None  Other Topics Concern  . None  Social History Narrative  . None    Past Medical History:  Diagnosis Date  . History of colonic polyps 05/09/2006  . Hypertension 03/20/2000  . Pre-diabetes  Patient Active Problem List   Diagnosis Date Noted  . Chest pressure 05/08/2017  . Hyperglycemia 11/23/2016  . GERD (gastroesophageal reflux disease) 10/13/2015  . Gallbladder sludge 10/13/2015  . Right-sided back  pain 09/28/2015  . Obesity (BMI 30-39.9) 09/10/2015  . Obstructive sleep apnea 09/10/2015  . BPH (benign prostatic hyperplasia) 07/13/2015  . Dysphagia 07/13/2015  . H/O adenomatous polyp of colon 07/13/2015  . Leg pain 07/13/2015  . Bad memory 07/13/2015  . Disturbance of skin sensation 07/13/2015  . Seborrheic keratosis 07/13/2015  . Vitamin D deficiency 07/13/2015  . ADD (attention deficit hyperactivity disorder, inattentive type) 09/02/2009  . Benign paroxysmal vertigo 07/27/2009  . Hyperglyceridemia, pure 03/23/2006  . Headache, migraine 03/22/2006  . Essential (primary) hypertension 03/20/2000    Past Surgical History:  Procedure Laterality Date  . APPENDECTOMY  1960  . BRAIN SURGERY  1960's   Removed pressure from brain caused by head injury  . nasal abscess  1960   no information provider  . TONSILLECTOMY AND ADENOIDECTOMY  1960  . UPPER GI ENDOSCOPY  02/20/12   [facility], Normal Esophagus, Nornal Stomach, Normal duodenum, Dr. Candace Cruise; Dilated for dysphagia  . UPPER GI ENDOSCOPY  09/16/2013   Dr. Tiffany Kocher; Changes suspicious for eosinophilic esophaigitis. Normal stomach and duodenum  . [place] History        Family Status  Relation Name Status  . Mother  Alive       poor health ill with lymphedema  . Father  Deceased at age 89       died from lung cancer  . Brother 1 Deceased       complications from HIV  . Brother 2 Deceased at age 55       died from a motor vehicle accident, had a MI while driving died from injury sustained in [place]  . Brother  (Not Specified)  . Brother  (Not Specified)        His family history includes Diabetes in his brother; Heart attack in his brother; Heart disease in his brother; Hypertension in his mother.     Allergies  Allergen Reactions  . Sulfa Antibiotics     Cause confusion     Current Outpatient Medications:  .  Cholecalciferol (VITAMIN D3) 2000 UNITS TABS, Take 5 tablets by mouth daily. , Disp: , Rfl:  .   Flaxseed, Linseed, (RA FLAX SEED OIL 1000 PO), Take 1 capsule by mouth daily., Disp: , Rfl:  .  irbesartan-hydrochlorothiazide (AVALIDE) 300-12.5 MG tablet, TAKE ONE (1) TABLET BY MOUTH EVERY DAY (Patient taking differently: TAKE ONE 1/2 TABLET BY MOUTH EVERY DAY), Disp: 30 tablet, Rfl: 12 .  KRILL OIL PO, Take 100 mg by mouth daily. , Disp: , Rfl:  .  metFORMIN (GLUCOPHAGE XR) 500 MG 24 hr tablet, 1/2 tablet daily. (Patient taking differently: Take 500 mg by mouth daily with breakfast. ), Disp: 30 tablet, Rfl: 3 .  Omega-3 Fatty Acids (FISH OIL CONCENTRATE) 1000 MG CAPS, Take 1 capsule by mouth daily. , Disp: , Rfl:  .  omeprazole (PRILOSEC) 40 MG capsule, Take 40 mg by mouth daily. , Disp: , Rfl:  .  topiramate (TOPAMAX) 25 MG tablet, Take 1 tablet by mouth 2 (two) times daily. , Disp: , Rfl:  .  saw palmetto 500 MG capsule, Take 500 mg by mouth daily., Disp: , Rfl:    Patient Care Team: Birdie Sons, MD as PCP - General (Family Medicine) Minna Merritts,  MD as Consulting Physician (Cardiology)      Objective:   Vitals: BP 120/60 (BP Location: Right Arm, Patient Position: Sitting, Cuff Size: Large)   Pulse 61   Temp 98.3 F (36.8 C) (Oral)   Resp 16   Ht 5\' 10"  (1.778 m)   Wt 211 lb (95.7 kg)   SpO2 98%   BMI 30.28 kg/m    Vitals:   11/22/17 1014  BP: 120/60  Pulse: 61  Resp: 16  Temp: 98.3 F (36.8 C)  TempSrc: Oral  SpO2: 98%  Weight: 211 lb (95.7 kg)  Height: 5\' 10"  (1.778 m)     Physical Exam   General Appearance:    Alert, cooperative, no distress, appears stated age  Head:    Normocephalic, without obvious abnormality, atraumatic  Eyes:    PERRL, conjunctiva/corneas clear, EOM's intact, fundi    benign, both eyes       Ears:    Normal TM's and external ear canals, both ears  Nose:   Nares normal, septum midline, mucosa normal, no drainage   or sinus tenderness  Throat:   Lips, mucosa, and tongue normal; teeth and gums normal  Neck:   Supple,  symmetrical, trachea midline, no adenopathy;       thyroid:  No enlargement/tenderness/nodules; no carotid   bruit or JVD  Back:     Symmetric, no curvature, ROM normal, no CVA tenderness  Lungs:     Clear to auscultation bilaterally, respirations unlabored  Chest wall:    No tenderness or deformity  Heart:    Regular rate and rhythm, S1 and S2 normal, no murmur, rub   or gallop  Abdomen:     Soft, non-tender, bowel sounds active all four quadrants,    no masses, no organomegaly  Genitalia:    deferred  Rectal:    deferred  Extremities:   Extremities normal, atraumatic, no cyanosis or edema  Pulses:   2+ and symmetric all extremities  Skin:   Skin color, texture, turgor normal, no rashes or lesions  Lymph nodes:   Cervical, supraclavicular, and axillary nodes normal  Neurologic:   CNII-XII intact. Normal strength, sensation and reflexes      throughout    Depression Screen PHQ 2/9 Scores 11/22/2017 11/22/2016 07/13/2015  PHQ - 2 Score 0 0 0  PHQ- 9 Score 0 3 5      Assessment & Plan:     Routine Health Maintenance and Physical Exam  Exercise Activities and Dietary recommendations Goals    None      Immunization History  Administered Date(s) Administered  . Influenza,inj,Quad PF,6+ Mos 11/22/2016, 07/05/2017  . Tdap 05/12/2012  . Zoster 07/13/2015    Health Maintenance  Topic Date Due  . HIV Screening  06/26/1970  . COLONOSCOPY  02/14/2022  . TETANUS/TDAP  05/12/2022  . INFLUENZA VACCINE  Completed  . Hepatitis C Screening  Completed     Discussed health benefits of physical activity, and encouraged him to engage in regular exercise appropriate for his age and condition.    -------------------------------------------------------------------- 1. Annual physical exam Generally doing well.   2. Essential (primary) hypertension Well controlled.  Continue current medications.    3. Prostate cancer screening  - PSA  4. Hyperglycemia Due to check  Hemoglobin  A1c. Doing well with metformin.   5. Vitamin D deficiency . - VITAMIN D 25 Hydroxy (Vit-D Deficiency, Fractures)  6. Hyperglyceridemia, pure Diet controlled.  - Lipid panel  Recommended shingrix. He declined today  but will consider.   Lelon Huh, MD  [facility]

## 2017-11-23 LAB — LIPID PANEL
CHOL/HDL RATIO: 2.9 ratio (ref 0.0–5.0)
Cholesterol, Total: 170 mg/dL (ref 100–199)
HDL: 59 mg/dL (ref >39)
LDL CALC: 89 mg/dL (ref 0–99)
Triglycerides: 110 mg/dL (ref 0–149)
VLDL Cholesterol Cal: 22 mg/dL (ref 5–40)

## 2017-11-23 LAB — HEMOGLOBIN A1C
Est. average glucose Bld gHb Est-mCnc: 108 mg/dL
Hgb A1c MFr Bld: 5.4 % (ref 4.8–5.6)

## 2017-11-23 LAB — VITAMIN D 25 HYDROXY (VIT D DEFICIENCY, FRACTURES): VIT D 25 HYDROXY: 80.4 ng/mL (ref 30.0–100.0)

## 2017-11-23 LAB — PSA: Prostate Specific Ag, Serum: 1.3 ng/mL (ref 0.0–4.0)

## 2017-11-27 DIAGNOSIS — M546 Pain in thoracic spine: Secondary | ICD-10-CM | POA: Diagnosis not present

## 2017-11-27 DIAGNOSIS — M7918 Myalgia, other site: Secondary | ICD-10-CM | POA: Diagnosis not present

## 2017-11-27 DIAGNOSIS — M9902 Segmental and somatic dysfunction of thoracic region: Secondary | ICD-10-CM | POA: Diagnosis not present

## 2018-01-02 ENCOUNTER — Ambulatory Visit (INDEPENDENT_AMBULATORY_CARE_PROVIDER_SITE_OTHER): Payer: 59 | Admitting: Family Medicine

## 2018-01-02 ENCOUNTER — Encounter: Payer: Self-pay | Admitting: Family Medicine

## 2018-01-02 VITALS — BP 110/64 | HR 67 | Temp 98.1°F | Resp 16 | Wt 217.0 lb

## 2018-01-02 DIAGNOSIS — M79606 Pain in leg, unspecified: Secondary | ICD-10-CM | POA: Diagnosis not present

## 2018-01-02 DIAGNOSIS — R609 Edema, unspecified: Secondary | ICD-10-CM

## 2018-01-02 NOTE — Progress Notes (Signed)
Patient: Arthur Davis Male    DOB: 02/12/1956   62 y.o.   MRN: [id number] Visit Date: 01/02/2018  Today's Provider: Lelon Huh, MD   Chief Complaint  Patient presents with  . Leg Pain   Subjective:    Patient states he has had pain in his shins for several months. Patient states when first occurred, it only hurt occasionally. Over the last 2 month pain is constant. Pain is described as an aching sensation. Patient has been treating pain with ice, heat and sometimes hemp cream with mild relief.   Leg Pain   There was no injury mechanism. The pain is present in the right leg and left leg. The quality of the pain is described as aching. The pain is at a severity of 4/10. The pain is mild. The pain has been fluctuating since onset. Pertinent negatives include no inability to bear weight, loss of motion, loss of sensation, muscle weakness, numbness or tingling. He reports no foreign bodies present. Nothing aggravates the symptoms. He has tried ice, heat and rest for the symptoms.  Affects both legs equally. Ice and heat helps. No erythema or swelling. Not too bad in the morning, worsens as day goes by, but      Allergies  Allergen Reactions  . Sulfa Antibiotics     Cause confusion     Current Outpatient Medications:  .  Cholecalciferol (VITAMIN D3) 2000 UNITS TABS, Take 5 tablets by mouth daily. , Disp: , Rfl:  .  Flaxseed, Linseed, (RA FLAX SEED OIL 1000 PO), Take 1 capsule by mouth daily., Disp: , Rfl:  .  irbesartan-hydrochlorothiazide (AVALIDE) 300-12.5 MG tablet, TAKE ONE (1) TABLET BY MOUTH EVERY DAY (Patient taking differently: TAKE ONE 1/2 TABLET BY MOUTH EVERY DAY), Disp: 30 tablet, Rfl: 12 .  KRILL OIL PO, Take 100 mg by mouth daily. , Disp: , Rfl:  .  metFORMIN (GLUCOPHAGE XR) 500 MG 24 hr tablet, 1/2 tablet daily. (Patient taking differently: Take 500 mg by mouth daily with breakfast. ), Disp: 30 tablet, Rfl: 3 .  Omega-3 Fatty Acids (FISH OIL CONCENTRATE) 1000 MG  CAPS, Take 1 capsule by mouth daily. , Disp: , Rfl:  .  omeprazole (PRILOSEC) 40 MG capsule, Take 40 mg by mouth daily. , Disp: , Rfl:  .  saw palmetto 500 MG capsule, Take 500 mg by mouth daily., Disp: , Rfl:  .  topiramate (TOPAMAX) 25 MG tablet, Take 1 tablet by mouth 2 (two) times daily. , Disp: , Rfl:   Review of Systems  Constitutional: Negative for appetite change, chills and fever.  Respiratory: Negative for chest tightness, shortness of breath and wheezing.   Cardiovascular: Negative for chest pain and palpitations.  Gastrointestinal: Negative for abdominal pain, nausea and vomiting.  Neurological: Negative for tingling and numbness.    Social History   Tobacco Use  . Smoking status: Never Smoker  . Smokeless tobacco: Never Used  Substance Use Topics  . Alcohol use: Yes    Alcohol/week: 0.0 oz    Comment: rare   Objective:   BP 110/64 (BP Location: Right Arm, Patient Position: Sitting, Cuff Size: Large)   Pulse 67   Temp 98.1 F (36.7 C) (Oral)   Resp 16   Wt 217 lb (98.4 kg)   SpO2 97%   BMI 31.14 kg/m     Physical Exam   General Appearance:    Alert, cooperative, no distress  Eyes:    PERRL, conjunctiva/corneas  clear, EOM's intact       Lungs:     Clear to auscultation bilaterally, respirations unlabored  Heart:    Regular rate and rhythm  Neurologic:   Awake, alert, oriented x 3. No apparent focal neurological           defect.   Extremities:   Trace pitting edema over soft tissue of anterior lower legs. No tibial tenderness.  A few varicosities.        Assessment & Plan:     1. Edema, unspecified type  - Comprehensive metabolic panel - Magnesium - TSH - Sed Rate (ESR)  2. Pain of anterior lower extremity, unspecified laterality  - Comprehensive metabolic panel - Magnesium - TSH - Sed Rate (ESR)  Very non-specific, but does not appear to be any bony abnormalities. May be related to mild edema or varicosities, although he reports it worsens  with leg elevated. He thinks it may be worse with cold weather and may be related to long underwear being to tight around his lower legs.       Lelon Huh, MD  [facility]

## 2018-01-03 LAB — COMPREHENSIVE METABOLIC PANEL
A/G RATIO: 2.4 — AB (ref 1.2–2.2)
ALK PHOS: 63 IU/L (ref 39–117)
ALT: 18 IU/L (ref 0–44)
AST: 19 IU/L (ref 0–40)
Albumin: 4.6 g/dL (ref 3.6–4.8)
BUN/Creatinine Ratio: 13 (ref 10–24)
BUN: 11 mg/dL (ref 8–27)
Bilirubin Total: 1.1 mg/dL (ref 0.0–1.2)
CHLORIDE: 103 mmol/L (ref 96–106)
CO2: 24 mmol/L (ref 20–29)
Calcium: 9.4 mg/dL (ref 8.6–10.2)
Creatinine, Ser: 0.85 mg/dL (ref 0.76–1.27)
GFR calc Af Amer: 108 mL/min/{1.73_m2} (ref >59)
GFR calc non Af Amer: 93 mL/min/{1.73_m2} (ref >59)
GLUCOSE: 84 mg/dL (ref 65–99)
Globulin, Total: 1.9 g/dL (ref 1.5–4.5)
Potassium: 3.5 mmol/L (ref 3.5–5.2)
Sodium: 144 mmol/L (ref 134–144)
Total Protein: 6.5 g/dL (ref 6.0–8.5)

## 2018-01-03 LAB — TSH: TSH: 1.35 u[IU]/mL (ref 0.450–4.500)

## 2018-01-03 LAB — MAGNESIUM: MAGNESIUM: 2.1 mg/dL (ref 1.6–2.3)

## 2018-01-03 LAB — SEDIMENTATION RATE: SED RATE: 2 mm/h (ref 0–30)

## 2018-01-04 ENCOUNTER — Telehealth: Payer: Self-pay | Admitting: *Deleted

## 2018-01-04 NOTE — Telephone Encounter (Signed)
Patient was notified of results. Expressed understanding. Patient wanted to know if you think it's possible he will have rebound migraines from taking the naproxen? Please advise?

## 2018-01-04 NOTE — Telephone Encounter (Signed)
-----   Message from Birdie Sons, MD sent at 01/04/2018  2:09 PM EST ----- Labs are all completely normal. May just have some mild inflammation in lower legs. Recommend trial of naproxen 500mg  one tablet twice a day for 14 days, #28, rf x 1. Call if not much better in a couple of weeks.

## 2018-01-05 MED ORDER — NAPROXEN 500 MG PO TABS: 500.0000 mg | ORAL_TABLET | Freq: Two times a day (BID) | ORAL | 1 refills | Status: DC

## 2018-01-05 NOTE — Telephone Encounter (Signed)
Advised patient. Medication was sent into [facility] per patient.

## 2018-01-05 NOTE — Telephone Encounter (Signed)
Only if he takes for every day for months at a time.

## 2018-01-20 ENCOUNTER — Encounter: Payer: Self-pay | Admitting: Emergency Medicine

## 2018-01-20 ENCOUNTER — Other Ambulatory Visit: Payer: Self-pay

## 2018-01-20 ENCOUNTER — Emergency Department
Admission: EM | Admit: 2018-01-20 | Discharge: 2018-01-20 | Disposition: A | Discharge status: Home / Self Care | Payer: 59 | Attending: Student in an Organized Health Care Education/Training Program | Admitting: Student in an Organized Health Care Education/Training Program

## 2018-01-20 DIAGNOSIS — Z7984 Long term (current) use of oral hypoglycemic drugs: Secondary | ICD-10-CM | POA: Insufficient documentation

## 2018-01-20 DIAGNOSIS — N3091 Cystitis, unspecified with hematuria: Secondary | ICD-10-CM | POA: Diagnosis not present

## 2018-01-20 DIAGNOSIS — N3001 Acute cystitis with hematuria: Secondary | ICD-10-CM | POA: Diagnosis not present

## 2018-01-20 DIAGNOSIS — I10 Essential (primary) hypertension: Secondary | ICD-10-CM | POA: Insufficient documentation

## 2018-01-20 DIAGNOSIS — Z79899 Other long term (current) drug therapy: Secondary | ICD-10-CM | POA: Diagnosis not present

## 2018-01-20 DIAGNOSIS — R339 Retention of urine, unspecified: Secondary | ICD-10-CM | POA: Diagnosis present

## 2018-01-20 LAB — CBC
HCT: 43.5 % (ref 40.0–52.0)
Hemoglobin: 15.2 g/dL (ref 13.0–18.0)
MCH: 32 pg (ref 26.0–34.0)
MCHC: 34.9 g/dL (ref 32.0–36.0)
MCV: 91.8 fL (ref 80.0–100.0)
PLATELETS: 98 10*3/uL — AB (ref 150–440)
RBC: 4.73 MIL/uL (ref 4.40–5.90)
RDW: 13.5 % (ref 11.5–14.5)
WBC: 17.2 10*3/uL — ABNORMAL HIGH (ref 3.8–10.6)

## 2018-01-20 LAB — URINALYSIS, COMPLETE (UACMP) WITH MICROSCOPIC
BILIRUBIN URINE: NEGATIVE
Glucose, UA: NEGATIVE mg/dL
KETONES UR: 5 mg/dL — AB
Nitrite: NEGATIVE
PH: 6 (ref 5.0–8.0)
Protein, ur: NEGATIVE mg/dL
Specific Gravity, Urine: 1.01 (ref 1.005–1.030)

## 2018-01-20 LAB — COMPREHENSIVE METABOLIC PANEL
ALT: 19 U/L (ref 17–63)
ANION GAP: 10 (ref 5–15)
AST: 24 U/L (ref 15–41)
Albumin: 4.6 g/dL (ref 3.5–5.0)
Alkaline Phosphatase: 53 U/L (ref 38–126)
BUN: 23 mg/dL — ABNORMAL HIGH (ref 6–20)
CHLORIDE: 105 mmol/L (ref 101–111)
CO2: 22 mmol/L (ref 22–32)
CREATININE: 1.13 mg/dL (ref 0.61–1.24)
Calcium: 9.1 mg/dL (ref 8.9–10.3)
GFR calc non Af Amer: >60 mL/min (ref >60)
Glucose, Bld: 194 mg/dL — ABNORMAL HIGH (ref 65–99)
POTASSIUM: 3 mmol/L — AB (ref 3.5–5.1)
SODIUM: 137 mmol/L (ref 135–145)
Total Bilirubin: 3.4 mg/dL — ABNORMAL HIGH (ref 0.3–1.2)
Total Protein: 7.2 g/dL (ref 6.5–8.1)

## 2018-01-20 MED ORDER — SODIUM CHLORIDE 0.9 % IV SOLN
1.0000 g | Freq: Once | INTRAVENOUS | Status: AC
  Administered 2018-01-20: 1 g via INTRAVENOUS
  Filled 2018-01-20: qty 10

## 2018-01-20 MED ORDER — CEPHALEXIN 500 MG PO CAPS: 500.0000 mg | ORAL_CAPSULE | Freq: Three times a day (TID) | ORAL | 0 refills | Status: AC

## 2018-01-20 MED ORDER — SODIUM CHLORIDE 0.9 % IV BOLUS (SEPSIS): 1000.0000 mL | Freq: Once | INTRAVENOUS | Status: DC

## 2018-01-20 NOTE — ED Provider Notes (Signed)
[facility] Emergency Department Provider Note    First MD Initiated Contact with Patient 01/20/18 1035     (approximate)  I have reviewed the triage vital signs and the nursing notes.   HISTORY  Chief Complaint Urinary Retention    HPI Arthur Davis is a 62 y.o. male is a chief complaint of decreased urine output and feeling that he is obstructed since yesterday.  States he was having pain with urination and dysuria 2-3 days ago.  Was having fevers and chills.  Patient in no acute distress.  No recent antibiotics.  Does have a history of enlarged prostate.  States he did stop drinking fluids because he thought he was getting retained urine 2 days ago and has since had decreased urine output.  Have mild low back pain but no flank pain.  This discomfort is mild in nature.  Past Medical History:  Diagnosis Date  . History of colonic polyps 05/09/2006  . Hypertension 03/20/2000  . Pre-diabetes    Family History  Problem Relation Age of Onset  . Hypertension Mother   . Diabetes Brother   . Heart disease Brother   . Heart attack Brother    Past Surgical History:  Procedure Laterality Date  . APPENDECTOMY  1960  . BRAIN SURGERY  1960's   Removed pressure from brain caused by head injury  . nasal abscess  1960   no information provider  . TONSILLECTOMY AND ADENOIDECTOMY  1960  . UPPER GI ENDOSCOPY  02/20/12   [facility], Normal Esophagus, Nornal Stomach, Normal duodenum, Dr. Candace Cruise; Dilated for dysphagia  . UPPER GI ENDOSCOPY  09/16/2013   Dr. Tiffany Kocher; Changes suspicious for eosinophilic esophaigitis. Normal stomach and duodenum  . VASECTOMY  1990   Patient Active Problem List   Diagnosis Date Noted  . Hyperglycemia 11/23/2016  . GERD (gastroesophageal reflux disease) 10/13/2015  . Gallbladder sludge 10/13/2015  . Right-sided back pain 09/28/2015  . Obesity (BMI 30-39.9) 09/10/2015  . Obstructive sleep apnea 09/10/2015  . BPH (benign prostatic  hyperplasia) 07/13/2015  . Dysphagia 07/13/2015  . H/O adenomatous polyp of colon 07/13/2015  . Leg pain 07/13/2015  . Bad memory 07/13/2015  . Disturbance of skin sensation 07/13/2015  . Seborrheic keratosis 07/13/2015  . Vitamin D deficiency 07/13/2015  . ADD (attention deficit hyperactivity disorder, inattentive type) 09/02/2009  . Benign paroxysmal vertigo 07/27/2009  . Hyperglyceridemia, pure 03/23/2006  . Headache, migraine 03/22/2006  . Essential (primary) hypertension 03/20/2000      Prior to Admission medications   Medication Sig Start Date End Date Taking? Authorizing Provider  amLODipine (NORVASC) 5 MG tablet Take 5 mg by mouth daily as needed.    [provider]  cephALEXin (KEFLEX) 500 MG capsule Take 1 capsule (500 mg total) by mouth 3 (three) times daily for 7 days. 01/20/18 01/27/18  Merlyn Lot, MD  Cholecalciferol (VITAMIN D3) 2000 UNITS TABS Take 5 tablets by mouth daily.  05/10/10   [provider]  Flaxseed, Linseed, (RA FLAX SEED OIL 1000 PO) Take 1 capsule by mouth daily.    [provider]  irbesartan-hydrochlorothiazide (AVALIDE) 300-12.5 MG tablet TAKE ONE (1) TABLET BY MOUTH EVERY DAY Patient taking differently: TAKE ONE 1/2 TABLET BY MOUTH EVERY DAY 06/28/17   Birdie Sons, MD  KRILL OIL PO Take 100 mg by mouth daily.     [provider]  metFORMIN (GLUCOPHAGE XR) 500 MG 24 hr tablet 1/2 tablet daily. Patient taking differently: Take 500 mg  by mouth daily with breakfast.  07/05/17   Birdie Sons, MD  naproxen (NAPROSYN) 500 MG tablet Take 1 tablet (500 mg total) by mouth 2 (two) times daily with a meal. 01/05/18   Birdie Sons, MD  Omega-3 Fatty Acids (FISH OIL CONCENTRATE) 1000 MG CAPS Take 1 capsule by mouth daily.  09/22/09   [provider]  omeprazole (PRILOSEC) 40 MG capsule Take 40 mg by mouth daily.  06/20/15   [provider]  saw palmetto 500 MG capsule Take 500 mg by mouth daily.     [provider]  topiramate (TOPAMAX) 25 MG tablet Take 1 tablet by mouth 2 (two) times daily.  12/23/14   [provider]    Allergies Sulfa antibiotics    Social History Social History   Tobacco Use  . Smoking status: Never Smoker  . Smokeless tobacco: Never Used  Substance Use Topics  . Alcohol use: Yes    Alcohol/week: 0.0 oz    Comment: rare  . Drug use: No    Review of Systems Patient denies headaches, rhinorrhea, blurry vision, numbness, shortness of breath, chest pain, edema, cough, abdominal pain, nausea, vomiting, diarrhea, dysuria, fevers, rashes or hallucinations unless otherwise stated above in HPI. ____________________________________________   PHYSICAL EXAM:  VITAL SIGNS: Vitals:   01/20/18 1230 01/20/18 1300  BP: (!) 122/99 113/72  Pulse: 68 69  Resp: (!) 25 (!) 21  Temp:    SpO2: 98% 99%    Constitutional: Alert and oriented. Well appearing and in no acute distress. Eyes: Conjunctivae are normal.  Head: Atraumatic. Nose: No congestion/rhinnorhea. Mouth/Throat: Mucous membranes are moist.   Neck: No stridor. Painless ROM.  Cardiovascular: Normal rate, regular rhythm. Grossly normal heart sounds.  Good peripheral circulation. Respiratory: Normal respiratory effort.  No retractions. Lungs CTAB. Gastrointestinal: Soft and nontender. No distention. No abdominal bruits. No CVA tenderness. Genitourinary:  Musculoskeletal: No lower extremity tenderness nor edema.  No joint effusions. Neurologic:  Normal speech and language. No gross focal neurologic deficits are appreciated. No facial droop Skin:  Skin is warm, dry and intact. No rash noted. Psychiatric: Mood and affect are normal. Speech and behavior are normal.  ____________________________________________   LABS (all labs ordered are listed, but only abnormal results are displayed)  Results for orders placed or performed during the hospital encounter of 01/20/18 (from the past 24  hour(s))  Urinalysis, Complete w Microscopic     Status: Abnormal   Collection Time: 01/20/18 10:23 AM  Result Value Ref Range   Color, Urine YELLOW (A) YELLOW   APPearance HAZY (A) CLEAR   Specific Gravity, Urine 1.010 1.005 - 1.030   pH 6.0 5.0 - 8.0   Glucose, UA NEGATIVE NEGATIVE mg/dL   Hgb urine dipstick MODERATE (A) NEGATIVE   Bilirubin Urine NEGATIVE NEGATIVE   Ketones, ur 5 (A) NEGATIVE mg/dL   Protein, ur NEGATIVE NEGATIVE mg/dL   Nitrite NEGATIVE NEGATIVE   Leukocytes, UA MODERATE (A) NEGATIVE   RBC / HPF 0-5 0 - 5 RBC/hpf   WBC, UA TOO NUMEROUS TO COUNT 0 - 5 WBC/hpf   Bacteria, UA MANY (A) NONE SEEN   Squamous Epithelial / LPF 0-5 (A) NONE SEEN   WBC Clumps PRESENT    Mucus PRESENT   CBC     Status: Abnormal   Collection Time: 01/20/18 10:23 AM  Result Value Ref Range   WBC 17.2 (H) 3.8 - 10.6 K/uL   RBC 4.73 4.40 - 5.90 MIL/uL   Hemoglobin  15.2 13.0 - 18.0 g/dL   HCT 43.5 40.0 - 52.0 %   MCV 91.8 80.0 - 100.0 fL   MCH 32.0 26.0 - 34.0 pg   MCHC 34.9 32.0 - 36.0 g/dL   RDW 13.5 11.5 - 14.5 %   Platelets 98 (L) 150 - 440 K/uL  Comprehensive metabolic panel     Status: Abnormal   Collection Time: 01/20/18 10:23 AM  Result Value Ref Range   Sodium 137 135 - 145 mmol/L   Potassium 3.0 (L) 3.5 - 5.1 mmol/L   Chloride 105 101 - 111 mmol/L   CO2 22 22 - 32 mmol/L   Glucose, Bld 194 (H) 65 - 99 mg/dL   BUN 23 (H) 6 - 20 mg/dL   Creatinine, Ser 1.13 0.61 - 1.24 mg/dL   Calcium 9.1 8.9 - 10.3 mg/dL   Total Protein 7.2 6.5 - 8.1 g/dL   Albumin 4.6 3.5 - 5.0 g/dL   AST 24 15 - 41 U/L   ALT 19 17 - 63 U/L   Alkaline Phosphatase 53 38 - 126 U/L   Total Bilirubin 3.4 (H) 0.3 - 1.2 mg/dL   GFR calc non Af Amer >60 >60 mL/min   GFR calc Af Amer >60 >60 mL/min   Anion gap 10 5 - 15   _____________________________________________________________________  RADIOLOGY   ____________________________________________   PROCEDURES  Procedure(s) performed:    Procedures    Critical Care performed: no ____________________________________________   INITIAL IMPRESSION / ASSESSMENT AND PLAN / ED COURSE  Pertinent labs & imaging results that were available during my care of the patient were reviewed by me and considered in my medical decision making (see chart for details).  DDX: UTI, cystitis, hematuria, urinary retention, urethritis, stone or prostatitis  Arthur Davis is a 62 y.o. who presents to the ED with symptoms as described above.  Patient with evidence of minimal urine and bladder but he is had decreased oral intake and does have some dehydration.  With oral hydration patient was able to drink 6 cups of water with increased urine output.  Urine does appear infected.  Will give dose of IV Rocephin as well as start patient on Keflex.  Have discussed with the patient and available family all diagnostics and treatments performed thus far and all questions were answered to the best of my ability. The patient demonstrates understanding and agreement with plan.       As part of my medical decision making, I reviewed the following data within the [place] notes reviewed and incorporated, Labs reviewed, notes from prior ED visits.    ____________________________________________   FINAL CLINICAL IMPRESSION(S) / ED DIAGNOSES  Final diagnoses:  Acute cystitis with hematuria      NEW MEDICATIONS STARTED DURING THIS VISIT:  New Prescriptions   CEPHALEXIN (KEFLEX) 500 MG CAPSULE    Take 1 capsule (500 mg total) by mouth 3 (three) times daily for 7 days.     Note:  This document was prepared using Dragon voice recognition software and may include unintentional dictation errors.    Merlyn Lot, MD 01/20/18 1318

## 2018-01-20 NOTE — ED Notes (Signed)
Pt presetns today as he states he is unable to urinate. Pt has ho of enlarged prostate. Pt states he has not urinated since 4 a.m. NAD VSS

## 2018-01-20 NOTE — ED Triage Notes (Signed)
Unable to urinate since 4 am. Had pain with urination x 2 days before that.

## 2018-01-20 NOTE — ED Notes (Signed)
Bladder scan 69mL at most. Pt states he has not been drinking much since yesterday because he felt like he was having trouble urinating. Pt given cup of water by EDP instruction.

## 2018-01-20 NOTE — ED Notes (Signed)

## 2018-02-05 ENCOUNTER — Ambulatory Visit (INDEPENDENT_AMBULATORY_CARE_PROVIDER_SITE_OTHER): Payer: 59 | Admitting: Family Medicine

## 2018-02-05 ENCOUNTER — Encounter: Payer: Self-pay | Admitting: Family Medicine

## 2018-02-05 VITALS — BP 140/80 | HR 64 | Temp 98.1°F | Resp 15 | Wt 215.4 lb

## 2018-02-05 DIAGNOSIS — R300 Dysuria: Secondary | ICD-10-CM | POA: Diagnosis not present

## 2018-02-05 MED ORDER — DOXYCYCLINE HYCLATE 100 MG PO TABS: 100.0000 mg | ORAL_TABLET | Freq: Two times a day (BID) | ORAL | 0 refills | Status: DC

## 2018-02-05 NOTE — Progress Notes (Signed)
Subjective:     Patient ID: Arthur Davis, male   DOB: [birth year], 62 y.o.   MRN: [id number] Chief Complaint  Patient presents with  . Cystitis    Patient returns to office for follow up, patient states that he was seen in ED on 01/20/18 with cystitis with hematuria he was prescribed course of antibiotics and finished the,. Patient reports in the past 24hrs his symptoms have returned similar to ER visit, patient complints of dysuria, frequency of urination and RLQ pain    HPI States his normal baseline of nocturia is x 1-2. Last night he states he was up every hour. Has hx of kidney stones and BPH. Not on [place] at this time.  Review of Systems     Objective:   Physical Exam  Constitutional: He appears well-developed and well-nourished. No distress.  Abdominal: Soft. There is no tenderness.       Assessment:    1. Dysuria: prescribed doxycycline but asked him to wait if possible until he is able to provide a urine sample in the AM.     Plan:   Further f/u pending urine evaluation. He is to report to the ER if unable to void this evening.

## 2018-02-05 NOTE — Patient Instructions (Signed)
Bring in a urine sample in the AM. Hold off on the antibiotic if you can until you can get the urine sample. If not voiding at all this evening-report to the ER.

## 2018-02-06 ENCOUNTER — Other Ambulatory Visit (INDEPENDENT_AMBULATORY_CARE_PROVIDER_SITE_OTHER): Payer: 59

## 2018-02-06 DIAGNOSIS — N3001 Acute cystitis with hematuria: Secondary | ICD-10-CM | POA: Diagnosis not present

## 2018-02-06 LAB — POCT URINALYSIS DIPSTICK
Bilirubin, UA: NEGATIVE
GLUCOSE UA: NEGATIVE
KETONES UA: NEGATIVE
Nitrite, UA: POSITIVE
Protein, UA: 100
Urobilinogen, UA: 0.2 E.U./dL
pH, UA: 6 (ref 5.0–8.0)

## 2018-02-06 NOTE — Progress Notes (Signed)
Patient brought in urine sample. UA checked in the office, and urine sent for culture per orders from last office visit.

## 2018-02-07 ENCOUNTER — Telehealth: Payer: Self-pay

## 2018-02-07 NOTE — Telephone Encounter (Signed)
-----   Message from Carmon Ginsberg, [place] sent at 02/07/2018  7:25 AM EDT ----- Urine is consistent with infection. Start antibiotic if you have not done so.

## 2018-02-07 NOTE — Telephone Encounter (Signed)
Patient was advised and states that he has already started antibiotic and has noticed some improvement with symptoms. Encouraged patient to complete antibiotic as prescribed and if symptoms return to give [place] a call back. Patient understood. KW

## 2018-02-09 ENCOUNTER — Telehealth: Payer: Self-pay

## 2018-02-09 LAB — URINE CULTURE

## 2018-02-09 NOTE — Telephone Encounter (Signed)
Patient advised.KW 

## 2018-02-09 NOTE — Telephone Encounter (Signed)
-----   Message from Carmon Ginsberg, [place] sent at 02/09/2018 11:52 AM EDT ----- He does have a UTI sensitive to doxycycline. Would f/u with Dr. Caryn Section regarding further urological treatment or evaluationl

## 2018-02-27 ENCOUNTER — Other Ambulatory Visit: Payer: Self-pay | Admitting: Family Medicine

## 2018-02-27 DIAGNOSIS — R7303 Prediabetes: Secondary | ICD-10-CM

## 2018-04-10 DIAGNOSIS — M7918 Myalgia, other site: Secondary | ICD-10-CM | POA: Diagnosis not present

## 2018-04-10 DIAGNOSIS — M546 Pain in thoracic spine: Secondary | ICD-10-CM | POA: Diagnosis not present

## 2018-04-10 DIAGNOSIS — M9902 Segmental and somatic dysfunction of thoracic region: Secondary | ICD-10-CM | POA: Diagnosis not present

## 2018-07-23 DIAGNOSIS — M9902 Segmental and somatic dysfunction of thoracic region: Secondary | ICD-10-CM | POA: Diagnosis not present

## 2018-07-23 DIAGNOSIS — M546 Pain in thoracic spine: Secondary | ICD-10-CM | POA: Diagnosis not present

## 2018-07-27 ENCOUNTER — Other Ambulatory Visit: Payer: Self-pay | Admitting: Family Medicine

## 2018-12-21 ENCOUNTER — Ambulatory Visit (INDEPENDENT_AMBULATORY_CARE_PROVIDER_SITE_OTHER): Payer: 59 | Admitting: Physician Assistant

## 2018-12-21 ENCOUNTER — Encounter: Payer: Self-pay | Admitting: Physician Assistant

## 2018-12-21 VITALS — BP 131/66 | HR 67 | Temp 99.1°F | Resp 16 | Wt 217.0 lb

## 2018-12-21 DIAGNOSIS — R69 Illness, unspecified: Secondary | ICD-10-CM

## 2018-12-21 DIAGNOSIS — J111 Influenza due to unidentified influenza virus with other respiratory manifestations: Secondary | ICD-10-CM

## 2018-12-21 MED ORDER — OSELTAMIVIR PHOSPHATE 75 MG PO CAPS: 75.0000 mg | ORAL_CAPSULE | Freq: Two times a day (BID) | ORAL | 0 refills | Status: DC

## 2018-12-21 MED ORDER — OSELTAMIVIR PHOSPHATE 75 MG PO CAPS: 75.0000 mg | ORAL_CAPSULE | Freq: Two times a day (BID) | ORAL | 0 refills | Status: AC

## 2018-12-21 NOTE — Patient Instructions (Signed)

## 2018-12-21 NOTE — Progress Notes (Signed)
Patient: Arthur Davis Male    DOB: Sep 26, 1956   63 y.o.   MRN: [id number] Visit Date: 12/21/2018  Today's Provider: Trinna Post, PA-C   Chief Complaint  Patient presents with  . URI   Subjective:     HPI Upper Respiratory Infection: Patient complains of symptoms of a URI. Symptoms include congestion and irritability. Onset of symptoms was 3 days ago, gradually worsening since that time. He also c/o congestion, low grade fever and non productive cough for the past 3 days . He reports muscle aches. He is drinking plenty of fluids. Evaluation to date: none. Treatment to date: cough suppressants and decongestants. He did not get his flu shot this year.     Allergies  Allergen Reactions  . Sulfa Antibiotics     Cause confusion     Current Outpatient Medications:  .  amLODipine (NORVASC) 2.5 MG tablet, Take 1 tablet (2.5 mg total) by mouth daily., Disp: 30 tablet, Rfl: 4 .  Cholecalciferol (VITAMIN D3) 2000 UNITS TABS, Take 5 tablets by mouth daily. , Disp: , Rfl:  .  Flaxseed, Linseed, (RA FLAX SEED OIL 1000 PO), Take 1 capsule by mouth daily., Disp: , Rfl:  .  irbesartan-hydrochlorothiazide (AVALIDE) 300-12.5 MG tablet, Take 1 tablet by mouth daily., Disp: 30 tablet, Rfl: 4 .  KRILL OIL PO, Take 100 mg by mouth daily. , Disp: , Rfl:  .  metFORMIN (GLUCOPHAGE-XR) 500 MG 24 hr tablet, TAKE ONE (1) TABLET BY MOUTH ONCE DAILY, Disp: 30 tablet, Rfl: 11 .  naproxen (NAPROSYN) 500 MG tablet, Take 1 tablet (500 mg total) by mouth 2 (two) times daily with a meal., Disp: 28 tablet, Rfl: 1 .  Omega-3 Fatty Acids (FISH OIL CONCENTRATE) 1000 MG CAPS, Take 1 capsule by mouth daily. , Disp: , Rfl:  .  omeprazole (PRILOSEC) 40 MG capsule, Take 40 mg by mouth daily. , Disp: , Rfl:  .  topiramate (TOPAMAX) 25 MG tablet, Take 1 tablet by mouth 2 (two) times daily. , Disp: , Rfl:  .  amLODipine (NORVASC) 5 MG tablet, Take 5 mg by mouth daily as needed., Disp: , Rfl:  .  oseltamivir  (TAMIFLU) 75 MG capsule, Take 1 capsule (75 mg total) by mouth 2 (two) times daily for 5 days., Disp: 10 capsule, Rfl: 0  Review of Systems  Constitutional: Negative.   HENT: Positive for congestion.   Respiratory: Positive for cough.   Cardiovascular: Negative.     Social History   Tobacco Use  . Smoking status: Never Smoker  . Smokeless tobacco: Never Used  Substance Use Topics  . Alcohol use: Yes    Alcohol/week: 0.0 standard drinks    Comment: rare      Objective:   BP 131/66 (BP Location: Left Arm, Patient Position: Sitting, Cuff Size: Large)   Pulse 67   Temp 99.1 F (37.3 C) (Oral)   Resp 16   Wt 217 lb (98.4 kg)   SpO2 96%   BMI 31.14 kg/m  Vitals:   12/21/18 1414  BP: 131/66  Pulse: 67  Resp: 16  Temp: 99.1 F (37.3 C)  TempSrc: Oral  SpO2: 96%  Weight: 217 lb (98.4 kg)     Physical Exam Constitutional:      Appearance: Normal appearance.  HENT:     Right Ear: Tympanic membrane and ear canal normal.     Left Ear: Tympanic membrane and ear canal normal.     Mouth/Throat:  Mouth: Mucous membranes are moist.     Pharynx: Oropharynx is clear.  Neck:     Musculoskeletal: Neck supple.  Cardiovascular:     Rate and Rhythm: Normal rate and regular rhythm.     Heart sounds: Normal heart sounds.  Pulmonary:     Effort: Pulmonary effort is normal.     Breath sounds: Normal breath sounds.  Lymphadenopathy:     Cervical: No cervical adenopathy.  Skin:    General: Skin is warm and dry.  Neurological:     Mental Status: He is alert and oriented to person, place, and time. Mental status is at baseline.  Psychiatric:        Mood and Affect: Mood normal.        Behavior: Behavior normal.         Assessment & Plan    1. Influenza-like illness  We are out of flu swabs today, will treat empirically for influenza.   - oseltamivir (TAMIFLU) 75 MG capsule; Take 1 capsule (75 mg total) by mouth 2 (two) times daily for 5 days.  Dispense: 10 capsule;  Refill: 0  The entirety of the information documented in the History of Present Illness, Review of Systems and Physical Exam were personally obtained by me. Portions of this information were initially documented by Lynford Humphrey, CMA and reviewed by me for thoroughness and accuracy.   Return if symptoms worsen or fail to improve.      Trinna Post, PA-C  [facility]

## 2019-01-10 ENCOUNTER — Other Ambulatory Visit: Payer: Self-pay | Admitting: Family Medicine

## 2019-02-08 ENCOUNTER — Other Ambulatory Visit: Payer: Self-pay | Admitting: Family Medicine

## 2019-02-08 DIAGNOSIS — R7303 Prediabetes: Secondary | ICD-10-CM

## 2019-02-08 NOTE — Telephone Encounter (Signed)
Please review

## 2019-04-16 ENCOUNTER — Other Ambulatory Visit: Payer: Self-pay | Admitting: Family Medicine

## 2019-05-15 ENCOUNTER — Other Ambulatory Visit: Payer: Self-pay | Admitting: Family Medicine

## 2019-05-31 ENCOUNTER — Telehealth: Payer: Self-pay | Admitting: Family Medicine

## 2019-05-31 NOTE — Telephone Encounter (Signed)
Pt advised that we don't have his blood type.   Thanks,   -Mickel Baas

## 2019-05-31 NOTE — Telephone Encounter (Signed)
Pt called wanting to know if we have his blood type list anywhere in his records.  CB#   [phone]  teri

## 2019-07-12 ENCOUNTER — Other Ambulatory Visit: Payer: Self-pay | Admitting: Family Medicine

## 2019-07-15 ENCOUNTER — Other Ambulatory Visit: Payer: Self-pay | Admitting: Family Medicine

## 2019-08-09 ENCOUNTER — Other Ambulatory Visit: Payer: Self-pay | Admitting: Family Medicine

## 2019-09-06 ENCOUNTER — Other Ambulatory Visit: Payer: Self-pay | Admitting: Family Medicine

## 2019-09-06 NOTE — Telephone Encounter (Signed)
Patient needs a follow up appointment. Left a message advising patient to call back to schedule.

## 2019-09-07 ENCOUNTER — Other Ambulatory Visit: Payer: Self-pay | Admitting: Family Medicine

## 2019-09-23 ENCOUNTER — Other Ambulatory Visit: Payer: Self-pay

## 2019-09-24 NOTE — Progress Notes (Signed)
Patient: Arthur Davis Male    DOB: Aug 12, 1956   63 y.o.   MRN: [id number] Visit Date: 09/25/2019  Today's Provider: Lelon Huh, MD   Chief Complaint  Patient presents with  . Hypertension  . Hyperlipidemia   Subjective:     Virtual Visit via Video Note  I connected with Arthur Davis on 09/25/19 at  8:00 AM EST by a video enabled telemedicine application and verified that I am speaking with the correct person using two identifiers.  Location: Patient: home Provider: [facility]   I discussed the limitations of evaluation and management by telemedicine and the availability of in person appointments. The patient expressed understanding and agreed to proceed.    HPI   Hypertension, follow-up:  BP Readings from Last 3 Encounters:  09/25/19 (!) 163/82  12/21/18 131/66  02/05/18 140/80    He was last seen for hypertension 1 years ago.  BP at that visit was 120/60. Management since that visit includes none. He reports good compliance with treatment. He is not having side effects.  He is exercising. He is adherent to low salt diet.   Outside blood pressures are 140-160/80-90. He is experiencing none.  Patient denies chest pain, chest pressure/discomfort, claudication, dyspnea, exertional chest pressure/discomfort, fatigue, irregular heart beat, lower extremity edema, near-syncope, orthopnea, palpitations, paroxysmal nocturnal dyspnea, syncope and tachypnea.   Cardiovascular risk factors include advanced age (older than 4 for men, 3 for women).  Use of agents associated with hypertension: none.     Weight trend: fluctuating a bit Wt Readings from Last 3 Encounters:  12/21/18 217 lb (98.4 kg)  02/05/18 215 lb 6.4 oz (97.7 kg)  01/20/18 210 lb (95.3 kg)    Current diet: well balanced  ------------------------------------------------------------------------  Lipid/Cholesterol, Follow-up:   Last seen for this1 years ago.  Management changes since that visit  include none. . Last Lipid Panel:    Component Value Date/Time   CHOL 170 11/22/2017 1059   TRIG 110 11/22/2017 1059   HDL 59 11/22/2017 1059   CHOLHDL 2.9 11/22/2017 1059   LDLCALC 89 11/22/2017 1059    Risk factors for vascular disease include hypercholesterolemia and hypertension  He reports good compliance with treatment. He is not having side effects.  Current symptoms include none and have been stable. Weight trend: fluctuating a bit Prior visit with dietician: no Current diet: well balanced Current exercise: cardiovascular workout on exercise equipment  Wt Readings from Last 3 Encounters:  12/21/18 217 lb (98.4 kg)  02/05/18 215 lb 6.4 oz (97.7 kg)  01/20/18 210 lb (95.3 kg)    -------------------------------------------------------------------  Is also due for follow up of pre-diabetes. Has not been watching diet as well lately.is taking metformin consistently. Home blood sugars have not been checked lately, but had been running in the low 100s.   He also reports he tends to get cramps in legs, but are alleviated by drinking more water  Allergies  Allergen Reactions  . Sulfa Antibiotics     Cause confusion     Current Outpatient Medications:  .  amLODipine (NORVASC) 2.5 MG tablet, TAKE (1) TABLET BY MOUTH EVERY DAY. *NEED APPOINTMENT FOR REFILLS*, Disp: 30 tablet, Rfl: 0 .  Cholecalciferol (VITAMIN D3) 2000 UNITS TABS, Take 5 tablets by mouth daily. , Disp: , Rfl:  .  Flaxseed, Linseed, (RA FLAX SEED OIL 1000 PO), Take 1 capsule by mouth daily., Disp: , Rfl:  .  irbesartan-hydrochlorothiazide (AVALIDE) 300-12.5 MG tablet, Take 1 tablet  by mouth daily., Disp: 30 tablet, Rfl: 0 .  KRILL OIL PO, Take 100 mg by mouth daily. , Disp: , Rfl:  .  Magnesium Oxide 500 MG (LAX) TABS, Take Over The Counter Magnesium Oxide 400 to 600 mg per day (if develop diarrhea - try magnesium glycinate 400 to 600 mg per day), Disp: , Rfl:  .  metFORMIN (GLUCOPHAGE-XR) 500 MG 24 hr  tablet, TAKE ONE (1) TABLET BY MOUTH ONCE DAILY, Disp: 30 tablet, Rfl: 11 .  Omega-3 Fatty Acids (FISH OIL CONCENTRATE) 1000 MG CAPS, Take 1 capsule by mouth daily. , Disp: , Rfl:  .  omeprazole (PRILOSEC) 40 MG capsule, Take 40 mg by mouth daily. , Disp: , Rfl:  .  topiramate (TOPAMAX) 25 MG tablet, Take 1 tablet by mouth 2 (two) times daily. , Disp: , Rfl:   Review of Systems  Constitutional: Negative for appetite change, chills and fever.  Respiratory: Negative for chest tightness, shortness of breath and wheezing.   Cardiovascular: Negative for chest pain and palpitations.  Gastrointestinal: Negative for abdominal pain, nausea and vomiting.    Social History   Tobacco Use  . Smoking status: Never Smoker  . Smokeless tobacco: Never Used  Substance Use Topics  . Alcohol use: Yes    Alcohol/week: 0.0 standard drinks    Comment: rare      Objective:   BP (!) 163/82   Pulse 69  Vitals:   09/25/19 1625 09/25/19 1626  BP: (!) 165/92 (!) 163/82  Pulse: 69   There is no height or weight on file to calculate BMI.   Physical Exam   Awake, alert, oriented x 3. In no distress    Assessment & Plan      1. Hyperglyceridemia, pure Diet controlled.  - Comprehensive metabolic panel - Lipid panel (fasting)  2. Essential (primary) hypertension Home SBP consistently mildly elevated, but he was taking medications inconsistently. He is going to get more strict with diet. Discussed increasing amlodipine if not improving in the next few weeks.   3. Vitamin D deficiency  - Vitamin D (25 hydroxy)  4. Hyperglycemia Doing well on metformin.  - HgB A1c  5. Prostate cancer screening  - PSA  6. Leg cramps Check electrolytes, seems to respond well to increase fluid intake.   He anticipates having flu vaccine at pharmacy.     I discussed the assessment and treatment plan with the patient. The patient was provided an opportunity to ask questions and all were answered. The  patient agreed with the plan and demonstrated an understanding of the instructions.   The patient was advised to call back or seek an in-person evaluation if the symptoms worsen or if the condition fails to improve as anticipated.  I provided 12 minutes of non-face-to-face time during this encounter.      Lelon Huh, MD  [facility]

## 2019-09-25 ENCOUNTER — Encounter: Payer: Self-pay | Admitting: Family Medicine

## 2019-09-27 ENCOUNTER — Ambulatory Visit (INDEPENDENT_AMBULATORY_CARE_PROVIDER_SITE_OTHER): Payer: Self-pay | Admitting: Family Medicine

## 2019-09-27 VITALS — BP 163/82 | HR 69

## 2019-09-27 DIAGNOSIS — E781 Pure hyperglyceridemia: Secondary | ICD-10-CM

## 2019-09-27 DIAGNOSIS — I10 Essential (primary) hypertension: Secondary | ICD-10-CM

## 2019-09-27 DIAGNOSIS — R252 Cramp and spasm: Secondary | ICD-10-CM

## 2019-09-27 DIAGNOSIS — E559 Vitamin D deficiency, unspecified: Secondary | ICD-10-CM

## 2019-09-27 DIAGNOSIS — Z125 Encounter for screening for malignant neoplasm of prostate: Secondary | ICD-10-CM

## 2019-09-27 DIAGNOSIS — R739 Hyperglycemia, unspecified: Secondary | ICD-10-CM

## 2019-10-15 ENCOUNTER — Other Ambulatory Visit: Payer: Self-pay | Admitting: Family Medicine

## 2019-10-15 DIAGNOSIS — G43809 Other migraine, not intractable, without status migrainosus: Secondary | ICD-10-CM

## 2019-10-15 MED ORDER — TOPIRAMATE 50 MG PO TABS: 50.0000 mg | ORAL_TABLET | Freq: Every day | ORAL | 3 refills | Status: DC

## 2019-10-15 NOTE — Telephone Encounter (Signed)
Medication: topiramate (TOPAMAX) 25 MG tablet [id number]     Pharmacy:  [facility], [place] Phone:  [phone]  Fax:  [phone]       Pt states that he is completley out of medication and would like this filled as soon as possible for migraines. Please advise

## 2019-10-15 NOTE — Telephone Encounter (Signed)
Dispense report shows he was last prescribed 30 50mg  tablets for 30 day supply by Dr. Manuella Ghazi on 05-03-2019 for migraines.

## 2019-10-23 ENCOUNTER — Other Ambulatory Visit: Payer: Self-pay

## 2019-10-23 ENCOUNTER — Ambulatory Visit
Admission: EM | Admit: 2019-10-23 | Discharge: 2019-10-24 | Disposition: A | Discharge status: Home / Self Care | Payer: 59 | Attending: Emergency Medicine | Admitting: Emergency Medicine

## 2019-10-23 ENCOUNTER — Emergency Department: Payer: Self-pay | Admitting: Anesthesiology

## 2019-10-23 ENCOUNTER — Encounter
Admission: EM | Disposition: A | Discharge status: Home / Self Care | Payer: Self-pay | Source: Home / Self Care | Attending: Emergency Medicine

## 2019-10-23 DIAGNOSIS — T18108A Unspecified foreign body in esophagus causing other injury, initial encounter: Secondary | ICD-10-CM

## 2019-10-23 DIAGNOSIS — R7303 Prediabetes: Secondary | ICD-10-CM | POA: Insufficient documentation

## 2019-10-23 DIAGNOSIS — T18128A Food in esophagus causing other injury, initial encounter: Secondary | ICD-10-CM | POA: Insufficient documentation

## 2019-10-23 DIAGNOSIS — M1712 Unilateral primary osteoarthritis, left knee: Secondary | ICD-10-CM | POA: Insufficient documentation

## 2019-10-23 DIAGNOSIS — K222 Esophageal obstruction: Secondary | ICD-10-CM

## 2019-10-23 DIAGNOSIS — Z7984 Long term (current) use of oral hypoglycemic drugs: Secondary | ICD-10-CM | POA: Insufficient documentation

## 2019-10-23 DIAGNOSIS — X58XXXA Exposure to other specified factors, initial encounter: Secondary | ICD-10-CM | POA: Insufficient documentation

## 2019-10-23 DIAGNOSIS — Z79899 Other long term (current) drug therapy: Secondary | ICD-10-CM | POA: Insufficient documentation

## 2019-10-23 DIAGNOSIS — Z881 Allergy status to other antibiotic agents status: Secondary | ICD-10-CM | POA: Insufficient documentation

## 2019-10-23 DIAGNOSIS — G4733 Obstructive sleep apnea (adult) (pediatric): Secondary | ICD-10-CM | POA: Insufficient documentation

## 2019-10-23 DIAGNOSIS — Z20828 Contact with and (suspected) exposure to other viral communicable diseases: Secondary | ICD-10-CM | POA: Insufficient documentation

## 2019-10-23 DIAGNOSIS — Z8249 Family history of ischemic heart disease and other diseases of the circulatory system: Secondary | ICD-10-CM | POA: Insufficient documentation

## 2019-10-23 DIAGNOSIS — K219 Gastro-esophageal reflux disease without esophagitis: Secondary | ICD-10-CM | POA: Insufficient documentation

## 2019-10-23 DIAGNOSIS — I10 Essential (primary) hypertension: Secondary | ICD-10-CM | POA: Insufficient documentation

## 2019-10-23 DIAGNOSIS — Z882 Allergy status to sulfonamides status: Secondary | ICD-10-CM | POA: Insufficient documentation

## 2019-10-23 HISTORY — PX: ESOPHAGOGASTRODUODENOSCOPY: SHX5428

## 2019-10-23 LAB — RESPIRATORY PANEL BY RT PCR (FLU A&B, COVID)
Influenza A by PCR: NEGATIVE
Influenza B by PCR: NEGATIVE
SARS Coronavirus 2 by RT PCR: NEGATIVE

## 2019-10-23 SURGERY — EGD (ESOPHAGOGASTRODUODENOSCOPY)
Anesthesia: General

## 2019-10-23 MED ORDER — IPRATROPIUM-ALBUTEROL 0.5-2.5 (3) MG/3ML IN SOLN
RESPIRATORY_TRACT | Status: AC
  Filled 2019-10-23: qty 3

## 2019-10-23 MED ORDER — SODIUM CHLORIDE 0.9 % IV SOLN
INTRAVENOUS | Status: DC
  Administered 2019-10-23: 1000 mL via INTRAVENOUS

## 2019-10-23 MED ORDER — SUCCINYLCHOLINE CHLORIDE 20 MG/ML IJ SOLN
INTRAMUSCULAR | Status: DC | PRN
  Administered 2019-10-23: 100 mg via INTRAVENOUS

## 2019-10-23 MED ORDER — GLUCAGON HCL RDNA (DIAGNOSTIC) 1 MG IJ SOLR
1.0000 mg | Freq: Once | INTRAMUSCULAR | Status: AC
  Administered 2019-10-23: 1 mg via INTRAVENOUS

## 2019-10-23 MED ORDER — GLUCAGON HCL RDNA (DIAGNOSTIC) 1 MG IJ SOLR
INTRAMUSCULAR | Status: AC
  Filled 2019-10-23: qty 1

## 2019-10-23 MED ORDER — PROPOFOL 10 MG/ML IV BOLUS
INTRAVENOUS | Status: DC | PRN
  Administered 2019-10-23: 150 mg via INTRAVENOUS

## 2019-10-23 MED ORDER — PROPOFOL 10 MG/ML IV BOLUS
INTRAVENOUS | Status: AC
  Filled 2019-10-23: qty 20

## 2019-10-23 MED ORDER — IPRATROPIUM-ALBUTEROL 0.5-2.5 (3) MG/3ML IN SOLN
3.0000 mL | Freq: Once | RESPIRATORY_TRACT | Status: AC
  Administered 2019-10-24: 3 mL via RESPIRATORY_TRACT

## 2019-10-23 MED ORDER — GLYCOPYRROLATE 0.2 MG/ML IJ SOLN
INTRAMUSCULAR | Status: DC | PRN
  Administered 2019-10-23: .2 mg via INTRAVENOUS

## 2019-10-23 NOTE — H&P (Signed)
Lucilla Lame, MD [address] [place], [place] [address] Phone:[phone] Fax : [phone]  Primary Care Physician:  Birdie Sons, MD Primary Gastroenterologist:  Dr. Allen Norris  Pre-Procedure History & Physical: HPI:  ILAI Davis is a 63 y.o. male is here for an endoscopy.   Past Medical History:  Diagnosis Date  . History of colonic polyps 05/09/2006  . Hypertension 03/20/2000  . Pre-diabetes     Past Surgical History:  Procedure Laterality Date  . APPENDECTOMY  1960  . BRAIN SURGERY  1960's   Removed pressure from brain caused by head injury  . nasal abscess  1960   no information provider  . TONSILLECTOMY AND ADENOIDECTOMY  1960  . UPPER GI ENDOSCOPY  02/20/12   [facility], Normal Esophagus, Nornal Stomach, Normal duodenum, Dr. Candace Cruise; Dilated for dysphagia  . UPPER GI ENDOSCOPY  09/16/2013   Dr. Tiffany Kocher; Changes suspicious for eosinophilic esophaigitis. Normal stomach and duodenum  . VASECTOMY  1990    Prior to Admission medications   Medication Sig Start Date End Date Taking? Authorizing Provider  amLODipine (NORVASC) 2.5 MG tablet TAKE (1) TABLET BY MOUTH EVERY DAY. *NEED APPOINTMENT FOR REFILLS* 09/06/19  Yes Birdie Sons, MD  Cholecalciferol (VITAMIN D3) 2000 UNITS TABS Take 5 tablets by mouth daily.  05/10/10  Yes [provider]  Flaxseed, Linseed, (RA FLAX SEED OIL 1000 PO) Take 1 capsule by mouth daily.   Yes [provider]  irbesartan-hydrochlorothiazide (AVALIDE) 300-12.5 MG tablet Take 1 tablet by mouth daily. 09/07/19  Yes Birdie Sons, MD  KRILL OIL PO Take 100 mg by mouth daily.    Yes [provider]  Magnesium Oxide 500 MG (LAX) TABS Take Over The Counter Magnesium Oxide 400 to 600 mg per day (if develop diarrhea - try magnesium glycinate 400 to 600 mg per day) 10/10/17  Yes [provider]  metFORMIN (GLUCOPHAGE-XR) 500 MG 24 hr tablet TAKE ONE (1) TABLET BY MOUTH ONCE DAILY 02/11/19  Yes Birdie Sons, MD  Omega-3 Fatty Acids (FISH OIL CONCENTRATE) 1000 MG CAPS Take 1 capsule by mouth daily.  09/22/09  Yes [provider]  omeprazole (PRILOSEC) 40 MG capsule Take 40 mg by mouth daily.  06/20/15  Yes [provider]  topiramate (TOPAMAX) 50 MG tablet Take 1 tablet (50 mg total) by mouth daily. 10/15/19  Yes Birdie Sons, MD    Allergies as of 10/23/2019 - Review Complete 10/23/2019  Allergen Reaction Noted  . Sulfa antibiotics  07/13/2015    Family History  Problem Relation Age of Onset  . Hypertension Mother   . Diabetes Brother   . Heart disease Brother   . Heart attack Brother     Social History   Socioeconomic History  . Marital status: Married    Spouse name: Not on file  . Number of children: 5  . Years of education: Not on file  . Highest education level: Not on file  Occupational History  . Occupation: HVAC work   Tobacco Use  . Smoking status: Never Smoker  . Smokeless tobacco: Never Used  Substance and Sexual Activity  . Alcohol use: Yes    Alcohol/week: 0.0 standard drinks    Comment: rare  . Drug use: No  . Sexual activity: Not on file  Other Topics Concern  . Not on file  Social History Narrative  . Not on file   Social Determinants of Health   Financial Resource Strain:   .  Difficulty of Paying Living Expenses: Not on file  Food Insecurity:   . Worried About Charity fundraiser in the Last Year: Not on file  . Ran Out of Food in the Last Year: Not on file  Transportation Needs:   . Lack of Transportation (Medical): Not on file  . Lack of Transportation (Non-Medical): Not on file  Physical Activity:   . Days of Exercise per Week: Not on file  . Minutes of Exercise per Session: Not on file  Stress:   . Feeling of Stress : Not on file  Social Connections:   . Frequency of Communication with Friends and Family: Not on file  . Frequency of Social Gatherings with Friends and Family: Not on file  . Attends Religious  Services: Not on file  . Active Member of Clubs or Organizations: Not on file  . Attends Archivist Meetings: Not on file  . Marital Status: Not on file  Intimate Partner Violence:   . Fear of Current or Ex-Partner: Not on file  . Emotionally Abused: Not on file  . Physically Abused: Not on file  . Sexually Abused: Not on file    Review of Systems: See HPI, otherwise negative ROS  Physical Exam: BP 135/78   Pulse 69   Temp 98.8 F (37.1 C) (Oral)   Resp 17   Ht 5\' 10"  (1.778 m)   Wt 99.8 kg   SpO2 99%   BMI 31.57 kg/m  General:   Alert,  pleasant and cooperative in NAD Head:  Normocephalic and atraumatic. Neck:  Supple; no masses or thyromegaly. Lungs:  Clear throughout to auscultation.    Heart:  Regular rate and rhythm. Abdomen:  Soft, nontender and nondistended. Normal bowel sounds, without guarding, and without rebound.   Neurologic:  Alert and  oriented x4;  grossly normal neurologically.  Impression/Plan: Arthur Davis is here for an endoscopy to be performed for food bolus in the esophagus.  Risks, benefits, limitations, and alternatives regarding  endoscopy have been reviewed with the patient.  Questions have been answered.  All parties agreeable.   Lucilla Lame, MD  10/23/2019, 11:13 PM

## 2019-10-23 NOTE — ED Triage Notes (Signed)
PT to ED via POV with c/o pork chop stuck in throat. HX of same, had to have endoscopy to remove FB. PT denies trouble breathing but is having to spit into a cup.

## 2019-10-23 NOTE — ED Notes (Signed)
Pt still feels like something is stuck in throat after glucagon administration. MD aware.

## 2019-10-23 NOTE — Op Note (Signed)
[facility] Patient Name: Arthur Davis Procedure Date: 10/23/2019 11:08 PM MRN: [id number] Account #: [id number] Date of Birth: May 10, 1956 Admit Type: Outpatient Age: 63 Room: [facility] ENDO ROOM 4 Gender: Male Note Status: Finalized Procedure:             Upper GI endoscopy Indications:           Foreign body in the esophagus Providers:             Lucilla Lame MD, MD Medicines:             General Anesthesia Complications:         No immediate complications. Procedure:             Pre-Anesthesia Assessment:                        - Prior to the procedure, a History and Physical was                         performed, and patient medications and allergies were                         reviewed. The patient's tolerance of previous                         anesthesia was also reviewed. The risks and benefits                         of the procedure and the sedation options and risks                         were discussed with the patient. All questions were                         answered, and informed consent was obtained. Prior                         Anticoagulants: The patient has taken no previous                         anticoagulant or antiplatelet agents. ASA Grade                         Assessment: II - A patient with mild systemic disease.                         After reviewing the risks and benefits, the patient                         was deemed in satisfactory condition to undergo the                         procedure.                        After obtaining informed consent, the endoscope was                         passed under direct vision. Throughout the procedure,  the patient's blood pressure, pulse, and oxygen                         saturations were monitored continuously. The Endoscope                         was introduced through the mouth, and advanced to the                         second part of  duodenum. The upper GI endoscopy was                         accomplished without difficulty. The patient tolerated                         the procedure well. Findings:      Food was found in the lower third of the esophagus. Removal of food was       accomplished.      The stomach was normal.      The examined duodenum was normal. Impression:            - Food in the lower third of the esophagus. Removal                         was successful.                        - Normal stomach.                        - Normal examined duodenum. Recommendation:        - Discharge patient to home.                        - Mechanical soft diet.                        - Continue present medications.                        - Repeat upper endoscopy in 3 weeks for retreatment. Procedure Code(s):     --- Professional ---                        [phone], Esophagogastroduodenoscopy, flexible,                         transoral; with removal of foreign body(s) Diagnosis Code(s):     --- Professional ---                        T18.108A, Unspecified foreign body in esophagus                         causing other injury, initial encounter CPT copyright 2019 American Medical Association. All rights reserved. The codes documented in this report are preliminary and upon coder review may  be revised to meet current compliance requirements. Lucilla Lame MD, MD 10/23/2019 11:39:06 PM This report has been signed electronically. Number of Addenda: 0 Note Initiated On: 10/23/2019 11:08 PM Estimated Blood Loss:  Estimated blood loss: none.  [facility]

## 2019-10-23 NOTE — Anesthesia Procedure Notes (Signed)
Procedure Name: Intubation Date/Time: 10/23/2019 11:25 PM Performed by: Lendon Colonel, CRNA Pre-anesthesia Checklist: Patient identified, Patient being monitored, Timeout performed, Emergency Drugs available and Suction available Patient Re-evaluated:Patient Re-evaluated prior to induction Oxygen Delivery Method: Circle system utilized Preoxygenation: Pre-oxygenation with 100% oxygen Induction Type: IV induction, Rapid sequence and Cricoid Pressure applied Laryngoscope Size: 3 and McGraph Grade View: Grade II Tube type: Oral Tube size: 7.0 mm Number of attempts: 1 Airway Equipment and Method: Stylet Placement Confirmation: ETT inserted through vocal cords under direct vision,  positive ETCO2 and breath sounds checked- equal and bilateral Secured at: 21 cm Tube secured with: Tape Dental Injury: Teeth and Oropharynx as per pre-operative assessment

## 2019-10-23 NOTE — Anesthesia Preprocedure Evaluation (Signed)
Anesthesia Evaluation  Patient identified by MRN, date of birth, ID band Patient awake    Reviewed: Allergy & Precautions, H&P , NPO status , Patient's Chart, lab work & pertinent test results, reviewed documented beta blocker date and time   Airway Mallampati: II  TM Distance: >3 FB Neck ROM: full    Dental  (+) Teeth Intact   Pulmonary sleep apnea and Continuous Positive Airway Pressure Ventilation ,    Pulmonary exam normal        Cardiovascular Exercise Tolerance: Poor hypertension, On Medications negative cardio ROS Normal cardiovascular exam Rhythm:regular Rate:Normal     Neuro/Psych  Headaches, PSYCHIATRIC DISORDERS    GI/Hepatic Neg liver ROS, GERD  Medicated,  Endo/Other  negative endocrine ROS  Renal/GU negative Renal ROS  negative genitourinary   Musculoskeletal   Abdominal   Peds  Hematology negative hematology ROS (+)   Anesthesia Other Findings Past Medical History: 05/09/2006: History of colonic polyps 03/20/2000: Hypertension No date: Pre-diabetes Past Surgical History: 1960: APPENDECTOMY 1960's: BRAIN SURGERY     Comment:  Removed pressure from brain caused by head injury 1960: nasal abscess     Comment:  no information provider 1960: TONSILLECTOMY AND ADENOIDECTOMY 02/20/12: UPPER GI ENDOSCOPY     Comment:  [facility], Normal Esophagus, Nornal Stomach, Normal duodenum,              Dr. Candace Cruise; Dilated for dysphagia 09/16/2013: UPPER GI ENDOSCOPY     Comment:  Dr. Tiffany Kocher; Changes suspicious for eosinophilic               esophaigitis. Normal stomach and duodenum 1990: VASECTOMY BMI    Body Mass Index: 31.57 kg/m     Reproductive/Obstetrics negative OB ROS                             Anesthesia Physical Anesthesia Plan  ASA: II and emergent  Anesthesia Plan: General ETT   Post-op Pain Management:    Induction:   PONV Risk Score and Plan:   Airway Management  Planned:   Additional Equipment:   Intra-op Plan:   Post-operative Plan:   Informed Consent: I have reviewed the patients History and Physical, chart, labs and discussed the procedure including the risks, benefits and alternatives for the proposed anesthesia with the patient or authorized representative who has indicated his/her understanding and acceptance.     Dental Advisory Given  Plan Discussed with: CRNA  Anesthesia Plan Comments:         Anesthesia Quick Evaluation

## 2019-10-23 NOTE — ED Provider Notes (Signed)
[facility] Emergency Department Provider Note   ____________________________________________   First MD Initiated Contact with Patient 10/23/19 2055     (approximate)  I have reviewed the triage vital signs and the nursing notes.   HISTORY  Chief Complaint Foreign Body    HPI Arthur Davis is a 63 y.o. male with past medical history of hypertension, GERD, esophageal stricture who presents to the ED for esophageal food bolus.  Patient reports that about 2 hours prior to arrival he was eating pork chops when he felt a piece get stuck in his chest.  He states he has been unable to swallow his spit since then, has had similar episodes of food being stuck in his esophagus requiring endoscopy and dilation.  He attempted to drink soda at home with no relief, states he was unable to swallow the soda.  He denies any cough or difficulty breathing.        Past Medical History:  Diagnosis Date  . History of colonic polyps 05/09/2006  . Hypertension 03/20/2000  . Pre-diabetes     Patient Active Problem List   Diagnosis Date Noted  . Foreign body in esophagus   . Osteoarthritis of left knee 10/30/2017  . Hyperglycemia 11/23/2016  . GERD (gastroesophageal reflux disease) 10/13/2015  . Gallbladder sludge 10/13/2015  . Right-sided back pain 09/28/2015  . Obesity (BMI 30-39.9) 09/10/2015  . Obstructive sleep apnea 09/10/2015  . BPH (benign prostatic hyperplasia) 07/13/2015  . Dysphagia 07/13/2015  . H/O adenomatous polyp of colon 07/13/2015  . Leg pain 07/13/2015  . Bad memory 07/13/2015  . Disturbance of skin sensation 07/13/2015  . Seborrheic keratosis 07/13/2015  . Vitamin D deficiency 07/13/2015  . ADD (attention deficit hyperactivity disorder, inattentive type) 09/02/2009  . Benign paroxysmal vertigo 07/27/2009  . Hyperglyceridemia, pure 03/23/2006  . Headache, migraine 03/22/2006  . Essential (primary) hypertension 03/20/2000    Past Surgical  History:  Procedure Laterality Date  . APPENDECTOMY  1960  . BRAIN SURGERY  1960's   Removed pressure from brain caused by head injury  . nasal abscess  1960   no information provider  . TONSILLECTOMY AND ADENOIDECTOMY  1960  . UPPER GI ENDOSCOPY  02/20/12   [facility], Normal Esophagus, Nornal Stomach, Normal duodenum, Dr. Candace Cruise; Dilated for dysphagia  . UPPER GI ENDOSCOPY  09/16/2013   Dr. Tiffany Kocher; Changes suspicious for eosinophilic esophaigitis. Normal stomach and duodenum  . VASECTOMY  1990    Prior to Admission medications   Medication Sig Start Date End Date Taking? Authorizing Provider  amLODipine (NORVASC) 2.5 MG tablet TAKE (1) TABLET BY MOUTH EVERY DAY. *NEED APPOINTMENT FOR REFILLS* 09/06/19  Yes Birdie Sons, MD  Cholecalciferol (VITAMIN D3) 2000 UNITS TABS Take 5 tablets by mouth daily.  05/10/10  Yes [provider]  Flaxseed, Linseed, (RA FLAX SEED OIL 1000 PO) Take 1 capsule by mouth daily.   Yes [provider]  irbesartan-hydrochlorothiazide (AVALIDE) 300-12.5 MG tablet Take 1 tablet by mouth daily. 09/07/19  Yes Birdie Sons, MD  KRILL OIL PO Take 100 mg by mouth daily.    Yes [provider]  Magnesium Oxide 500 MG (LAX) TABS Take Over The Counter Magnesium Oxide 400 to 600 mg per day (if develop diarrhea - try magnesium glycinate 400 to 600 mg per day) 10/10/17  Yes [provider]  metFORMIN (GLUCOPHAGE-XR) 500 MG 24 hr tablet TAKE ONE (1) TABLET BY MOUTH ONCE DAILY 02/11/19  Yes Birdie Sons, MD  Omega-3 Fatty Acids (FISH OIL CONCENTRATE) 1000 MG CAPS Take 1 capsule by mouth daily.  09/22/09  Yes [provider]  omeprazole (PRILOSEC) 40 MG capsule Take 40 mg by mouth daily.  06/20/15  Yes [provider]  topiramate (TOPAMAX) 50 MG tablet Take 1 tablet (50 mg total) by mouth daily. 10/15/19  Yes Birdie Sons, MD    Allergies Sulfa antibiotics  Family History  Problem Relation Age of Onset  . Hypertension  Mother   . Diabetes Brother   . Heart disease Brother   . Heart attack Brother     Social History Social History   Tobacco Use  . Smoking status: Never Smoker  . Smokeless tobacco: Never Used  Substance Use Topics  . Alcohol use: Yes    Alcohol/week: 0.0 standard drinks    Comment: rare  . Drug use: No    Review of Systems  Constitutional: No fever/chills Eyes: No visual changes. ENT: No sore throat. Cardiovascular: Denies chest pain. Respiratory: Denies shortness of breath. Gastrointestinal: No abdominal pain.  No nausea, no vomiting.  No diarrhea.  No constipation.  Positive for esophageal food impaction. Genitourinary: Negative for dysuria. Musculoskeletal: Negative for back pain. Skin: Negative for rash. Neurological: Negative for headaches, focal weakness or numbness.  ____________________________________________   PHYSICAL EXAM:  VITAL SIGNS: ED Triage Vitals  Enc Vitals Group     BP 10/23/19 2053 (!) 147/78     Pulse Rate 10/23/19 2050 62     Resp 10/23/19 2050 20     Temp 10/23/19 2050 98.8 F (37.1 C)     Temp src --      SpO2 10/23/19 2050 99 %     Weight 10/23/19 2051 220 lb (99.8 kg)     Height 10/23/19 2051 5\' 10"  (1.778 m)     Head Circumference --      Peak Flow --      Pain Score 10/23/19 2051 2     Pain Loc --      Pain Edu? --      Excl. in [place]? --     Constitutional: Alert and oriented. Eyes: Conjunctivae are normal. Head: Atraumatic. Nose: No congestion/rhinnorhea. Mouth/Throat: Mucous membranes are moist.  Oropharynx clear, difficulty swallowing spit. Neck: Normal ROM Cardiovascular: Normal rate, regular rhythm. Grossly normal heart sounds. Respiratory: Normal respiratory effort.  No retractions. Lungs CTAB. Gastrointestinal: Soft and nontender. No distention. Genitourinary: deferred Musculoskeletal: No lower extremity tenderness nor edema. Neurologic:  Normal speech and language. No gross focal neurologic deficits are  appreciated. Skin:  Skin is warm, dry and intact. No rash noted. Psychiatric: Mood and affect are normal. Speech and behavior are normal.  ____________________________________________   LABS (all labs ordered are listed, but only abnormal results are displayed)  Labs Reviewed  RESPIRATORY PANEL BY RT PCR (FLU A&B, COVID)    PROCEDURES  Procedure(s) performed (including Critical Care):  Procedures   ____________________________________________   INITIAL IMPRESSION / ASSESSMENT AND PLAN / ED COURSE       63 year old male presents to the ED with sensation of junk of pork stuck in his throat, similar to prior episodes requiring endoscopy.  He is in no respiratory distress but is having difficulty tolerating his oral secretions.  Suspect esophageal food bolus impaction, will trial dose of glucagon.  If this is unsuccessful, will need to contact GI for endoscopy.  Patient had episode of vomiting following glucagon administration, but this is clear saliva and did not contain any food.  He continues to complain of feeling like something is stuck in his chest.  Case discussed with Dr. Allen Norris of GI, who will plan on removing food bolus endoscopically.      ____________________________________________   FINAL CLINICAL IMPRESSION(S) / ED DIAGNOSES  Final diagnoses:  Esophageal obstruction due to food impaction     ED Discharge Orders         Ordered    Increase activity slowly     10/23/19 2350    Diet - low sodium heart healthy     10/23/19 2350           Note:  This document was prepared using Dragon voice recognition software and may include unintentional dictation errors.   Blake Divine, MD 10/23/19 2352

## 2019-10-24 MED ORDER — FENTANYL CITRATE (PF) 100 MCG/2ML IJ SOLN
25.0000 ug | INTRAMUSCULAR | Status: DC | PRN
  Administered 2019-10-24 (×3): 25 ug via INTRAVENOUS

## 2019-10-24 MED ORDER — ONDANSETRON HCL 4 MG/2ML IJ SOLN: 4.0000 mg | Freq: Once | INTRAMUSCULAR | Status: DC | PRN

## 2019-10-24 MED ORDER — FENTANYL CITRATE (PF) 100 MCG/2ML IJ SOLN
INTRAMUSCULAR | Status: AC
  Administered 2019-10-24: 25 ug via INTRAVENOUS
  Filled 2019-10-24: qty 2

## 2019-10-24 NOTE — Anesthesia Post-op Follow-up Note (Signed)
Anesthesia QCDR form completed.        

## 2019-10-24 NOTE — Discharge Instructions (Signed)

## 2019-10-24 NOTE — Transfer of Care (Signed)
Immediate Anesthesia Transfer of Care Note  Patient: Arthur Davis  Procedure(s) Performed: ESOPHAGOGASTRODUODENOSCOPY (EGD) (N/A )  Patient Location: PACU  Anesthesia Type:General  Level of Consciousness: awake, alert , oriented and patient cooperative  Airway & Oxygen Therapy: Patient Spontanous Breathing and Patient connected to face mask oxygen  Post-op Assessment: Report given to RN and Post -op Vital signs reviewed and stable  Post vital signs: Reviewed and stable  Last Vitals:  Vitals Value Taken Time  BP 120/69 10/23/19 2357  Temp 36.2 C 10/23/19 2357  Pulse 81 10/24/19 0003  Resp 13 10/24/19 0003  SpO2 100 % 10/24/19 0003  Vitals shown include unvalidated device data.  Last Pain:  Vitals:   10/23/19 2357  TempSrc:   PainSc: 7          Complications: No apparent anesthesia complications

## 2019-10-28 ENCOUNTER — Encounter: Payer: Self-pay | Admitting: *Deleted

## 2019-10-30 ENCOUNTER — Other Ambulatory Visit: Payer: Self-pay | Admitting: Family Medicine

## 2019-11-04 NOTE — Anesthesia Postprocedure Evaluation (Signed)
Anesthesia Post Note  Patient: Arthur Davis  Procedure(s) Performed: ESOPHAGOGASTRODUODENOSCOPY (EGD) (N/A )  Patient location during evaluation: PACU Anesthesia Type: General Level of consciousness: awake and alert Pain management: pain level controlled Vital Signs Assessment: post-procedure vital signs reviewed and stable Respiratory status: spontaneous breathing, nonlabored ventilation, respiratory function stable and patient connected to nasal cannula oxygen Cardiovascular status: blood pressure returned to baseline and stable Postop Assessment: no apparent nausea or vomiting Anesthetic complications: no     Last Vitals:  Vitals:   10/24/19 0042 10/24/19 0103  BP: 112/60 (!) 144/74  Pulse: 77 84  Resp: 13 20  Temp: (!) 36.1 C   SpO2: 95% 96%    Last Pain:  Vitals:   10/24/19 0103  TempSrc:   PainSc: [place] Lashaunta Sicard

## 2019-12-19 DIAGNOSIS — Z03818 Encounter for observation for suspected exposure to other biological agents ruled out: Secondary | ICD-10-CM | POA: Diagnosis not present

## 2019-12-19 DIAGNOSIS — Z20828 Contact with and (suspected) exposure to other viral communicable diseases: Secondary | ICD-10-CM | POA: Diagnosis not present

## 2019-12-23 ENCOUNTER — Other Ambulatory Visit: Payer: Self-pay | Admitting: Family Medicine

## 2019-12-23 DIAGNOSIS — R7303 Prediabetes: Secondary | ICD-10-CM

## 2019-12-23 NOTE — Telephone Encounter (Signed)
LOV 09/27/2019 Patient did not have labs done that were ordered at that [place].

## 2019-12-23 NOTE — Telephone Encounter (Signed)
Refill requests.  LOV 09/27/19. No labs >1 year.   Routing to PCP for further consideration.

## 2019-12-24 NOTE — Telephone Encounter (Signed)
Please advise patient we need his labs done. Have sent in 30 day supply of meds to get by until he gets labs done.

## 2020-01-05 DIAGNOSIS — Z23 Encounter for immunization: Secondary | ICD-10-CM | POA: Diagnosis not present

## 2020-01-21 ENCOUNTER — Other Ambulatory Visit: Payer: Self-pay | Admitting: Family Medicine

## 2020-01-21 DIAGNOSIS — G43809 Other migraine, not intractable, without status migrainosus: Secondary | ICD-10-CM

## 2020-01-21 NOTE — Telephone Encounter (Signed)
LOV  09/27/19  LRF  10/15/19  # 30 x 3

## 2020-01-26 DIAGNOSIS — Z23 Encounter for immunization: Secondary | ICD-10-CM | POA: Diagnosis not present

## 2020-02-13 ENCOUNTER — Other Ambulatory Visit: Payer: Self-pay | Admitting: Family Medicine

## 2020-02-13 ENCOUNTER — Telehealth: Payer: Self-pay

## 2020-02-13 DIAGNOSIS — E559 Vitamin D deficiency, unspecified: Secondary | ICD-10-CM

## 2020-02-13 DIAGNOSIS — I10 Essential (primary) hypertension: Secondary | ICD-10-CM

## 2020-02-13 DIAGNOSIS — Z125 Encounter for screening for malignant neoplasm of prostate: Secondary | ICD-10-CM

## 2020-02-13 DIAGNOSIS — G43809 Other migraine, not intractable, without status migrainosus: Secondary | ICD-10-CM

## 2020-02-13 DIAGNOSIS — R7303 Prediabetes: Secondary | ICD-10-CM

## 2020-02-13 NOTE — Telephone Encounter (Signed)
Copied from [place] [id number]. Topic: General - Other >> Feb 13, 2020 11:42 AM Sheran Luz wrote: Patient requesting orders for "routine bloodwork" before upcoming CPE in May. He states he is overdue for this.

## 2020-02-13 NOTE — Telephone Encounter (Signed)
Requested medication (s) are due for refill today -yes  Requested medication (s) are on the active medication list -yes  Future visit scheduled -yes  Last refill: 10/15/19 3 RF  Notes to clinic: request for non delegated Rx  Requested Prescriptions  Pending Prescriptions Disp Refills   topiramate (TOPAMAX) 50 MG tablet [Pharmacy Med Name: TOPIRAMATE 50 MG TAB] 30 tablet 3    Sig: TAKE (1) TABLET BY MOUTH EVERY DAY      Not Delegated - Neurology: Anticonvulsants - topiramate & zonisamide Failed - 02/13/2020 12:12 PM      Failed - This refill cannot be delegated      Failed - Cr in normal range and within 360 days    Creatinine  Date Value Ref Range Status  10/25/2012 1.41 (H) 0.60 - 1.30 mg/dL Final   Creatinine, Ser  Date Value Ref Range Status  01/20/2018 1.13 0.61 - 1.24 mg/dL Final          Failed - CO2 in normal range and within 360 days    CO2  Date Value Ref Range Status  01/20/2018 22 22 - 32 mmol/L Final   Co2  Date Value Ref Range Status  10/25/2012 24 21 - 32 mmol/L Final          Passed - Valid encounter within last 12 months    Recent Outpatient Visits           4 months ago Hyperglyceridemia, pure   [facility] Birdie Sons, MD   1 year ago Influenza-like illness   [place], Wendee Beavers, [place]   2 years ago [place], [place]   2 years ago Edema, unspecified type   [facility] Birdie Sons, MD   2 years ago Annual physical exam   [facility] Birdie Sons, MD                  Requested Prescriptions  Pending Prescriptions Disp Refills   topiramate (TOPAMAX) 50 MG tablet [Pharmacy Med Name: TOPIRAMATE 50 MG TAB] 30 tablet 3    Sig: TAKE (1) TABLET BY MOUTH EVERY DAY      Not Delegated - Neurology: Anticonvulsants - topiramate & zonisamide Failed - 02/13/2020 12:12 PM      Failed - This refill cannot be delegated     Failed - Cr in normal range and within 360 days    Creatinine  Date Value Ref Range Status  10/25/2012 1.41 (H) 0.60 - 1.30 mg/dL Final   Creatinine, Ser  Date Value Ref Range Status  01/20/2018 1.13 0.61 - 1.24 mg/dL Final          Failed - CO2 in normal range and within 360 days    CO2  Date Value Ref Range Status  01/20/2018 22 22 - 32 mmol/L Final   Co2  Date Value Ref Range Status  10/25/2012 24 21 - 32 mmol/L Final          Passed - Valid encounter within last 12 months    Recent Outpatient Visits           4 months ago Hyperglyceridemia, pure   [facility] Birdie Sons, MD   1 year ago Influenza-like illness   [place], Wendee Beavers, [place]   2 years ago [place], [place]   2 years ago Edema, unspecified type   [facility] Birdie Sons, MD   2 years ago Annual physical exam   [facility] Birdie Sons, MD

## 2020-02-13 NOTE — Telephone Encounter (Signed)
Requested medication (s) are due for refill today: yes  Requested medication (s) are on the active medication list: yes  Last refill:  12/23/2019  Future visit scheduled: yes  Notes to clinic:  PT SAYS HAS SCHEDULED AN APT, BUT NEEDS MEDS BEFORE APT BC [facility] E IS COMPLETELY OUT. [place] YOU   Requested Prescriptions  Pending Prescriptions Disp Refills   topiramate (TOPAMAX) 50 MG tablet [Pharmacy Med Name: TOPIRAMATE 50 MG TAB] 30 tablet 3    Sig: TAKE (1) TABLET BY MOUTH EVERY DAY      Not Delegated - Neurology: Anticonvulsants - topiramate & zonisamide Failed - 02/13/2020 11:59 AM      Failed - This refill cannot be delegated      Failed - Cr in normal range and within 360 days    Creatinine  Date Value Ref Range Status  10/25/2012 1.41 (H) 0.60 - 1.30 mg/dL Final   Creatinine, Ser  Date Value Ref Range Status  01/20/2018 1.13 0.61 - 1.24 mg/dL Final          Failed - CO2 in normal range and within 360 days    CO2  Date Value Ref Range Status  01/20/2018 22 22 - 32 mmol/L Final   Co2  Date Value Ref Range Status  10/25/2012 24 21 - 32 mmol/L Final          Passed - Valid encounter within last 12 months    Recent Outpatient Visits           4 months ago Hyperglyceridemia, pure   [place] [facility] Birdie Sons, MD   1 year ago Influenza-like illness   [facility] [place], Wendee Beavers, [place]   2 years ago [place], [place]   2 years ago Edema, unspecified type   [facility] Birdie Sons, MD   2 years ago Annual physical exam   [facility] Birdie Sons, MD

## 2020-02-13 NOTE — Telephone Encounter (Signed)
Pt would like to know if Topiramate 50 MG   can be refilled to last him through his appt on 03/18/20. Stated he is out of medication and this was the first available appt with PCP. Please advise.

## 2020-02-19 DIAGNOSIS — R7303 Prediabetes: Secondary | ICD-10-CM | POA: Insufficient documentation

## 2020-02-19 NOTE — Telephone Encounter (Signed)
Good idea. I added to the labs.

## 2020-02-19 NOTE — Telephone Encounter (Signed)
Please print orders, needs to be fasting

## 2020-02-19 NOTE — Telephone Encounter (Signed)
Should we add an A1C? He has Metformin listed on his medication list.

## 2020-02-19 NOTE — Addendum Note (Signed)
Addended by: Birdie Sons on: 02/19/2020 05:29 PM   Modules accepted: Orders

## 2020-02-19 NOTE — Addendum Note (Signed)
Addended by: Birdie Sons on: 02/19/2020 01:46 PM   Modules accepted: Orders

## 2020-02-21 ENCOUNTER — Other Ambulatory Visit: Payer: Self-pay

## 2020-02-21 DIAGNOSIS — N400 Enlarged prostate without lower urinary tract symptoms: Secondary | ICD-10-CM | POA: Diagnosis not present

## 2020-02-21 DIAGNOSIS — R7303 Prediabetes: Secondary | ICD-10-CM

## 2020-02-21 DIAGNOSIS — E669 Obesity, unspecified: Secondary | ICD-10-CM

## 2020-02-21 DIAGNOSIS — I10 Essential (primary) hypertension: Secondary | ICD-10-CM | POA: Diagnosis not present

## 2020-02-21 DIAGNOSIS — E559 Vitamin D deficiency, unspecified: Secondary | ICD-10-CM | POA: Diagnosis not present

## 2020-02-22 LAB — COMPREHENSIVE METABOLIC PANEL
ALT: 20 IU/L (ref 0–44)
AST: 18 IU/L (ref 0–40)
Albumin/Globulin Ratio: 2.5 — ABNORMAL HIGH (ref 1.2–2.2)
Albumin: 4.9 g/dL — ABNORMAL HIGH (ref 3.8–4.8)
Alkaline Phosphatase: 67 IU/L (ref 39–117)
BUN/Creatinine Ratio: 15 (ref 10–24)
BUN: 16 mg/dL (ref 8–27)
Bilirubin Total: 1.1 mg/dL (ref 0.0–1.2)
CO2: 22 mmol/L (ref 20–29)
Calcium: 9.3 mg/dL (ref 8.6–10.2)
Chloride: 102 mmol/L (ref 96–106)
Creatinine, Ser: 1.05 mg/dL (ref 0.76–1.27)
GFR calc Af Amer: 86 mL/min/{1.73_m2} (ref >59)
GFR calc non Af Amer: 75 mL/min/{1.73_m2} (ref >59)
Globulin, Total: 2 g/dL (ref 1.5–4.5)
Glucose: 119 mg/dL — ABNORMAL HIGH (ref 65–99)
Potassium: 3.5 mmol/L (ref 3.5–5.2)
Sodium: 140 mmol/L (ref 134–144)
Total Protein: 6.9 g/dL (ref 6.0–8.5)

## 2020-02-22 LAB — CBC WITH DIFFERENTIAL/PLATELET
Basophils Absolute: 0 10*3/uL (ref 0.0–0.2)
Basos: 0 %
EOS (ABSOLUTE): 0.2 10*3/uL (ref 0.0–0.4)
Eos: 3 %
Hematocrit: 43.2 % (ref 37.5–51.0)
Hemoglobin: 15.4 g/dL (ref 13.0–17.7)
Immature Grans (Abs): 0 10*3/uL (ref 0.0–0.1)
Immature Granulocytes: 0 %
Lymphocytes Absolute: 1.3 10*3/uL (ref 0.7–3.1)
Lymphs: 19 %
MCH: 33.3 pg — ABNORMAL HIGH (ref 26.6–33.0)
MCHC: 35.6 g/dL (ref 31.5–35.7)
MCV: 93 fL (ref 79–97)
Monocytes Absolute: 0.5 10*3/uL (ref 0.1–0.9)
Monocytes: 8 %
Neutrophils Absolute: 4.8 10*3/uL (ref 1.4–7.0)
Neutrophils: 70 %
Platelets: 144 10*3/uL — ABNORMAL LOW (ref 150–450)
RBC: 4.63 x10E6/uL (ref 4.14–5.80)
RDW: 13.1 % (ref 11.6–15.4)
WBC: 6.9 10*3/uL (ref 3.4–10.8)

## 2020-02-22 LAB — PSA: Prostate Specific Ag, Serum: 2.7 ng/mL (ref 0.0–4.0)

## 2020-02-22 LAB — LIPID PANEL WITH LDL/HDL RATIO
Cholesterol, Total: 132 mg/dL (ref 100–199)
HDL: 36 mg/dL — ABNORMAL LOW (ref >39)
LDL Chol Calc (NIH): 61 mg/dL (ref 0–99)
LDL/HDL Ratio: 1.7 ratio (ref 0.0–3.6)
Triglycerides: 215 mg/dL — ABNORMAL HIGH (ref 0–149)
VLDL Cholesterol Cal: 35 mg/dL (ref 5–40)

## 2020-02-22 LAB — VITAMIN D 25 HYDROXY (VIT D DEFICIENCY, FRACTURES): Vit D, 25-Hydroxy: 126 ng/mL — ABNORMAL HIGH (ref 30.0–100.0)

## 2020-02-22 LAB — HEMOGLOBIN A1C
Est. average glucose Bld gHb Est-mCnc: 117 mg/dL
Hgb A1c MFr Bld: 5.7 % — ABNORMAL HIGH (ref 4.8–5.6)

## 2020-02-26 ENCOUNTER — Telehealth: Payer: Self-pay | Admitting: Family Medicine

## 2020-02-26 ENCOUNTER — Other Ambulatory Visit: Payer: Self-pay | Admitting: Family Medicine

## 2020-02-26 MED ORDER — AMLODIPINE BESYLATE 2.5 MG PO TABS: ORAL_TABLET | ORAL | 1 refills | Status: DC

## 2020-02-26 MED ORDER — IRBESARTAN-HYDROCHLOROTHIAZIDE 300-12.5 MG PO TABS: ORAL_TABLET | ORAL | 1 refills | Status: DC

## 2020-02-26 NOTE — Telephone Encounter (Signed)
Dr Caryn Section Could you sign off on these so I can give patient recommendations Thanks

## 2020-02-26 NOTE — Telephone Encounter (Signed)
Patient requesting call back from clinical staff to discuss results from 4/23.

## 2020-02-26 NOTE — Telephone Encounter (Signed)
irbesartan-hydrochlorothiazide (AVALIDE) 300-12.5 MG tablet amLODipine (NORVASC) 2.5 MG tablet     Patient is requesting refill.    Pharmacy:  [place], [place] - [address] Phone:  [phone]  Fax:  [phone]

## 2020-02-27 ENCOUNTER — Telehealth: Payer: Self-pay

## 2020-02-27 NOTE — Addendum Note (Signed)
Addended by: Birdie Sons on: 02/27/2020 08:17 AM   Modules accepted: Orders

## 2020-02-27 NOTE — Telephone Encounter (Signed)
FYI  Patient was on Vit D 5000 units, 3 daily.  He is going to decrease that to 1 every third day until gone and then go to 2000 daily.

## 2020-02-27 NOTE — Telephone Encounter (Signed)
-----   Message from Birdie Sons, MD sent at 02/27/2020  7:59 AM EDT ----- Vitamin d levels are too high. Please verify how much vitamin d he is taking. Chart says 5 tablets daily but only take one daily if that is the case.  Sugar is a little bit high. Need to strictly avoid sweets in diet. Rest of labs normal. Follow up o.v. in May as scheduled.

## 2020-03-09 ENCOUNTER — Other Ambulatory Visit: Payer: Self-pay | Admitting: Family Medicine

## 2020-03-09 DIAGNOSIS — G43809 Other migraine, not intractable, without status migrainosus: Secondary | ICD-10-CM

## 2020-03-09 NOTE — Telephone Encounter (Signed)
Requested medication (s) are due for refill today - within 1 week  Requested medication (s) are on the active medication list -yes  Future visit scheduled -yes  Last refill: 02/14/20  Notes to clinic: Request for non delegated Rx  Requested Prescriptions  Pending Prescriptions Disp Refills   topiramate (TOPAMAX) 50 MG tablet [Pharmacy Med Name: TOPIRAMATE 50 MG TAB] 30 tablet 0    Sig: TAKE (1) TABLET BY MOUTH EVERY DAY      Not Delegated - Neurology: Anticonvulsants - topiramate & zonisamide Failed - 03/09/2020 11:59 AM      Failed - This refill cannot be delegated      Passed - Cr in normal range and within 360 days    Creatinine  Date Value Ref Range Status  10/25/2012 1.41 (H) 0.60 - 1.30 mg/dL Final   Creatinine, Ser  Date Value Ref Range Status  02/21/2020 1.05 0.76 - 1.27 mg/dL Final          Passed - CO2 in normal range and within 360 days    CO2  Date Value Ref Range Status  02/21/2020 22 20 - 29 mmol/L Final   Co2  Date Value Ref Range Status  10/25/2012 24 21 - 32 mmol/L Final          Passed - Valid encounter within last 12 months    Recent Outpatient Visits           5 months ago Hyperglyceridemia, pure   [facility] Birdie Sons, MD   1 year ago Influenza-like illness   [facility] [place], Wendee Beavers, [place]   2 years ago [place], [place]   2 years ago Edema, unspecified type   [facility] Birdie Sons, MD   2 years ago Annual physical exam   [facility] Birdie Sons, MD                  Requested Prescriptions  Pending Prescriptions Disp Refills   topiramate (TOPAMAX) 50 MG tablet [Pharmacy Med Name: TOPIRAMATE 50 MG TAB] 30 tablet 0    Sig: TAKE (1) TABLET BY MOUTH EVERY DAY      Not Delegated - Neurology: Anticonvulsants - topiramate & zonisamide Failed - 03/09/2020 11:59 AM      Failed - This refill cannot be delegated       Passed - Cr in normal range and within 360 days    Creatinine  Date Value Ref Range Status  10/25/2012 1.41 (H) 0.60 - 1.30 mg/dL Final   Creatinine, Ser  Date Value Ref Range Status  02/21/2020 1.05 0.76 - 1.27 mg/dL Final          Passed - CO2 in normal range and within 360 days    CO2  Date Value Ref Range Status  02/21/2020 22 20 - 29 mmol/L Final   Co2  Date Value Ref Range Status  10/25/2012 24 21 - 32 mmol/L Final          Passed - Valid encounter within last 12 months    Recent Outpatient Visits           5 months ago Hyperglyceridemia, pure   [facility] Birdie Sons, MD   1 year ago Influenza-like illness   [place], Wendee Beavers, [place]   2 years ago [place], [place]   2 years ago Edema, unspecified type  [facility] Caryn Section, Kirstie Peri, MD   2 years ago Annual physical exam   [facility] Birdie Sons, MD

## 2020-03-17 NOTE — Progress Notes (Signed)
Trena Platt Cummings,acting as a scribe for Arthur Huh, MD.,have documented all relevant documentation on the behalf of Arthur Huh, MD,as directed by  Arthur Huh, MD while in the presence of Arthur Huh, MD.  Complete physical exam   Patient: Arthur Davis   DOB: Apr 27, 1956   64 y.o. Male  MRN: [id number] Visit Date: 03/18/2020  Today's healthcare provider: Lelon Huh, MD   Chief Complaint  Patient presents with  . Annual Exam  . Hypertension  . Hyperglycemia   Subjective    SPENCE Arthur Davis is a 64 y.o. male who presents today for a complete physical exam.  He reports consuming a low sodium and low carb diet. Home exercise routine includes walking. He generally feels poorly. He reports sleeping well. He does have additional problems to discuss today.  HPI  Patient would like to discuss fatigue.   Hypertension, follow-up  BP Readings from Last 3 Encounters:  03/18/20 103/66  10/24/19 (!) 144/74  09/25/19 (!) 163/82   Wt Readings from Last 3 Encounters:  03/18/20 209 lb 12.8 oz (95.2 kg)  10/23/19 220 lb (99.8 kg)  12/21/18 217 lb (98.4 kg)     He was last seen for hypertension 6 months ago.  BP at that visit was see above. Management since that visit includes patient states he will get more strict with diet. He reports excellent compliance with treatment. He is not having side effects.  He is exercising. He is adherent to low salt diet.   Outside blood pressures are 118/65.  He does not smoke.  Use of agents associated with hypertension: none.   --------------------------------------------------------------------------------------------------- Lipid/Cholesterol, follow-up  Last Lipid Panel: Lab Results  Component Value Date   CHOL 132 02/21/2020   LDLCALC 61 02/21/2020   HDL 36 (L) 02/21/2020   TRIG 215 (H) 02/21/2020    He was last seen for this 6 months ago.  Management since that visit includes no change.  He reports excellent  compliance with treatment. He is not having side effects.   Symptoms: No appetite changes No foot ulcerations  No chest pain No chest pressure/discomfort  No dyspnea No orthopnea  Yes fatigue No lower extremity edema  No palpitations No paroxysmal nocturnal dyspnea  No nausea No numbness or tingling of extremity  Yes polydipsia No polyuria  No speech difficulty No syncope   He is following a Low Sodium, low carb diet. Current exercise: walking  Last metabolic panel Lab Results  Component Value Date   GLUCOSE 119 (H) 02/21/2020   NA 140 02/21/2020   K 3.5 02/21/2020   BUN 16 02/21/2020   CREATININE 1.05 02/21/2020   GFRNONAA 75 02/21/2020   GFRAA 86 02/21/2020   CALCIUM 9.3 02/21/2020   AST 18 02/21/2020   ALT 20 02/21/2020   The 10-year ASCVD risk score Arthur Bussing DC Jr., et al., 2013) is: 8.5%  --------------------------------------------------------------------------------------------------- Follow up for Hyperglycemia:  The patient was last seen for this 6 months ago. Changes made at last visit include patient advised to strictly avoid sweets in diet.  He reports excellent compliance with treatment. He feels that condition is Improved. He is not having side effects.   Lab Results  Component Value Date   HGBA1C 5.7 (H) 02/21/2020   ----------------------------------------------------------------------------------------- Complains of feeling more fatigued the last few months. Falls asleep easily at night and feels sleepy during the day. He has history OSA on CPAP machine for several years. Initially ordered by Dr. Manuella Davis.  Wears it  every night and was replaced about 3 years ago. Is on Auto adjusting CPAP.   Past Medical History:  Diagnosis Date  . History of colonic polyps 05/09/2006  . Hypertension 03/20/2000  . Pre-diabetes    Past Surgical History:  Procedure Laterality Date  . APPENDECTOMY  1960  . BRAIN SURGERY  1960's   Removed pressure from brain caused by  head injury  . ESOPHAGOGASTRODUODENOSCOPY N/A 10/23/2019   Procedure: ESOPHAGOGASTRODUODENOSCOPY (EGD);  Surgeon: Arthur Lame, MD;  Location: [facility] ENDOSCOPY;  Service: Endoscopy;  Laterality: N/A;  . nasal abscess  1960   no information provider  . TONSILLECTOMY AND ADENOIDECTOMY  1960  . UPPER GI ENDOSCOPY  02/20/12   [facility], Normal Esophagus, Nornal Stomach, Normal duodenum, Dr. Candace Davis; Dilated for dysphagia  . UPPER GI ENDOSCOPY  09/16/2013   Dr. Tiffany Davis; Changes suspicious for eosinophilic esophaigitis. Normal stomach and duodenum  . VASECTOMY  1990   Social History   Socioeconomic History  . Marital status: Married    Spouse name: Not on file  . Number of children: 5  . Years of education: Not on file  . Highest education level: Not on file  Occupational History  . Occupation: HVAC work   Tobacco Use  . Smoking status: Never Smoker  . Smokeless tobacco: Never Used  Substance and Sexual Activity  . Alcohol use: Yes    Alcohol/week: 0.0 standard drinks    Comment: rare  . Drug use: No  . Sexual activity: Not on file  Other Topics Concern  . Not on file  Social History Narrative  . Not on file   Social Determinants of Health   Financial Resource Strain:   . Difficulty of Paying Living Expenses:   Food Insecurity:   . Worried About Charity fundraiser in the Last Year:   . Arboriculturist in the Last Year:   Transportation Needs:   . Film/video editor (Medical):   Arthur Davis Lack of Transportation (Non-Medical):   Physical Activity:   . Days of Exercise per Week:   . Minutes of Exercise per Session:   Stress:   . Feeling of Stress :   Social Connections:   . Frequency of Communication with Friends and Family:   . Frequency of Social Gatherings with Friends and Family:   . Attends Religious Services:   . Active Member of Clubs or Organizations:   . Attends Archivist Meetings:   Arthur Davis Marital Status:   Intimate Partner Violence:   . Fear of Current or  Ex-Partner:   . Emotionally Abused:   Arthur Davis Physically Abused:   . Sexually Abused:    Family Status  Relation Name Status  . Mother  Alive       poor health ill with lymphedema  . Father  Deceased at age 15       died from lung cancer  . Brother 1 Deceased       complications from HIV  . Brother 2 Deceased at age 108       died from a motor vehicle accident, had a MI while driving died from injury sustained in [place]  . Brother  (Not Specified)  . Brother  (Not Specified)   Family History  Problem Relation Age of Onset  . Hypertension Mother   . Diabetes Brother   . Heart disease Brother   . Heart attack Brother    Allergies  Allergen Reactions  . Sulfa Antibiotics     Cause  confusion    Patient Care Team: Birdie Sons, MD as PCP - General (Family Medicine) Minna Merritts, MD as Consulting Physician (Cardiology)   Medications: Outpatient Medications Prior to Visit  Medication Sig  . amLODipine (NORVASC) 2.5 MG tablet TAKE (1) TABLET BY MOUTH EVERY DAY  . Cholecalciferol (VITAMIN D3) 2000 UNITS TABS Take 1 tablet by mouth daily. Takes 1 tablet 2 or 3 times per week.  . Flaxseed, Linseed, (RA FLAX SEED OIL 1000 PO) Take 1 capsule by mouth daily.  . irbesartan-hydrochlorothiazide (AVALIDE) 300-12.5 MG tablet TAKE (1) TABLET BY MOUTH EVERY DAY  . Magnesium Oxide 500 MG (LAX) TABS Take Over The Counter Magnesium Oxide 400 to 600 mg per day (if develop diarrhea - try magnesium glycinate 400 to 600 mg per day)  . metFORMIN (GLUCOPHAGE-XR) 500 MG 24 hr tablet TAKE ONE TABLET BY MOUTH ONCE DAILY.  Arthur Davis Omega-3 Fatty Acids (FISH OIL CONCENTRATE) 1000 MG CAPS Take 1 capsule by mouth daily.   Arthur Davis omeprazole (PRILOSEC) 40 MG capsule Take 40 mg by mouth daily.   Arthur Davis topiramate (TOPAMAX) 50 MG tablet Take 1 tablet (50 mg total) by mouth daily.  Arthur Davis KRILL OIL PO Take 100 mg by mouth daily.    No facility-administered medications prior to visit.    Review of Systems  Constitutional:  Positive for fatigue.  HENT: Positive for congestion.   Eyes: Negative.   Respiratory: Negative.   Cardiovascular: Negative.   Gastrointestinal: Negative.   Endocrine: Negative.   Genitourinary: Negative.   Musculoskeletal: Negative.   Skin: Negative.   Allergic/Immunologic: Negative.   Neurological: Negative.   Hematological: Negative.   Psychiatric/Behavioral: Negative.       Objective    BP 103/66 (BP Location: Left Arm, Patient Position: Sitting, Cuff Size: Large)   Pulse (!) 58   Temp (!) 97.1 F (36.2 C) (Temporal)   Ht 5\' 10"  (1.778 m)   Wt 209 lb 12.8 oz (95.2 kg)   BMI 30.10 kg/m    Physical Exam   General Appearance:    Obese male. Alert, cooperative, in no acute distress, appears stated age  Head:    Normocephalic, without obvious abnormality, atraumatic  Eyes:    PERRL, conjunctiva/corneas clear, EOM's intact, fundi    benign, both eyes       Ears:    Normal TM's and external ear canals, both ears  Nose:   Nares normal, septum midline, mucosa normal, no drainage   or sinus tenderness  Throat:   Lips, mucosa, and tongue normal; teeth and gums normal  Neck:   Supple, symmetrical, trachea midline, no adenopathy;       thyroid:  No enlargement/tenderness/nodules; no carotid   bruit or JVD  Back:     Symmetric, no curvature, ROM normal, no CVA tenderness  Lungs:     Clear to auscultation bilaterally, respirations unlabored  Chest wall:    No tenderness or deformity  Heart:    Bradycardic. Normal rhythm. No murmurs, rubs, or gallops.  S1 and S2 normal  Abdomen:     Soft, non-tender, bowel sounds active all four quadrants,    no masses, no organomegaly  Genitalia:    deferred  Rectal:    deferred  Extremities:   All extremities are intact. No cyanosis or edema  Pulses:   2+ and symmetric all extremities  Skin:   Skin color, texture, turgor normal, no rashes or lesions  Lymph nodes:   Cervical, supraclavicular, and axillary nodes normal  Neurologic:    CNII-XII intact. Normal strength, sensation and reflexes      throughout     Depression Screen  PHQ 2/9 Scores 11/22/2017 11/22/2016 07/13/2015  PHQ - 2 Score 0 0 0  PHQ- 9 Score 0 3 5    No results found for any visits on 03/18/20.  Assessment & Plan    Routine Health Maintenance and Physical Exam  Exercise Activities and Dietary recommendations Goals   None     Immunization History  Administered Date(s) Administered  . Influenza,inj,Quad PF,6+ Mos 11/22/2016, 07/05/2017  . Tdap 05/12/2012  . Zoster 07/13/2015    Health Maintenance  Topic Date Due  . HIV Screening  Never done  . COVID-19 Vaccine (1) Never done  . INFLUENZA VACCINE  05/31/2020  . COLONOSCOPY  02/14/2022  . TETANUS/TDAP  05/12/2022  . Hepatitis C Screening  Completed    Discussed health benefits of physical activity, and encouraged him to engage in regular exercise appropriate for his age and condition.   2. Essential (primary) hypertension Is relatively hypotensive today with fatigue. Will reduced irbesartan-hctz to 150-12.5 and recheck BP in 2-3 months.   3. Hyperglyceridemia, pure Diet controlled.   4. Hypersomnia May be related to OSA, although he states he does use CPAP every single night, and that it is autotitrating.   5. Obstructive sleep apnea  - Pulse oximetry, overnight; Future  6. Encounter for special screening examination for cardiovascular disorder  - EKG 12-Lead   No follow-ups on file.     The entirety of the information documented in the History of Present Illness, Review of Systems and Physical Exam were personally obtained by me. Portions of this information were initially documented by the CMA and reviewed by me for thoroughness and accuracy.      Arthur Huh, MD  [facility] [phone] (phone) [phone] (fax)  [place]

## 2020-03-18 ENCOUNTER — Encounter: Payer: Self-pay | Admitting: Family Medicine

## 2020-03-18 ENCOUNTER — Ambulatory Visit (INDEPENDENT_AMBULATORY_CARE_PROVIDER_SITE_OTHER): Payer: BC Managed Care – PPO | Admitting: Family Medicine

## 2020-03-18 ENCOUNTER — Other Ambulatory Visit: Payer: Self-pay

## 2020-03-18 VITALS — BP 110/68 | HR 58 | Temp 97.1°F | Ht 70.0 in | Wt 209.8 lb

## 2020-03-18 DIAGNOSIS — Z136 Encounter for screening for cardiovascular disorders: Secondary | ICD-10-CM | POA: Diagnosis not present

## 2020-03-18 DIAGNOSIS — G4710 Hypersomnia, unspecified: Secondary | ICD-10-CM

## 2020-03-18 DIAGNOSIS — E781 Pure hyperglyceridemia: Secondary | ICD-10-CM | POA: Diagnosis not present

## 2020-03-18 DIAGNOSIS — Z0000 Encounter for general adult medical examination without abnormal findings: Secondary | ICD-10-CM | POA: Diagnosis not present

## 2020-03-18 DIAGNOSIS — I10 Essential (primary) hypertension: Secondary | ICD-10-CM

## 2020-03-18 DIAGNOSIS — G4733 Obstructive sleep apnea (adult) (pediatric): Secondary | ICD-10-CM

## 2020-03-18 MED ORDER — IRBESARTAN-HYDROCHLOROTHIAZIDE 150-12.5 MG PO TABS: 1.0000 | ORAL_TABLET | Freq: Every day | ORAL | 1 refills | Status: DC

## 2020-03-18 NOTE — Patient Instructions (Signed)
.   Please review the attached list of medications and notify my office if there are any errors.   . Please bring all of your medications to every appointment so we can make sure that our medication list is the same as yours.   . Please contact your eyecare professional to schedule a routine eye exam. I recommend seeing Dr. Jomarie Longs at [phone] or the [facility] at [phone]

## 2020-05-28 ENCOUNTER — Telehealth: Payer: Self-pay | Admitting: Family Medicine

## 2020-05-28 NOTE — Telephone Encounter (Signed)
Dose was changed to 150-12.5 once a day

## 2020-05-28 NOTE — Telephone Encounter (Signed)
Seth Bake with [facility] is calling regarding  irbesartan-hydrochlorothiazide (AVALIDE) 150-12.5 MG tablet [[id number]] also received script for irbesartan-hydrochlorothiazide (AVALIDE) 300-12.5 MG tablet [[id number]]  DISCONTINUED  Wanting to confim which is correct. CB- [phone]

## 2020-05-29 NOTE — Telephone Encounter (Signed)
Seth Bake with [facility] advised.

## 2020-06-18 ENCOUNTER — Other Ambulatory Visit: Payer: Self-pay | Admitting: Family Medicine

## 2020-06-18 DIAGNOSIS — R7303 Prediabetes: Secondary | ICD-10-CM

## 2020-06-18 DIAGNOSIS — G43809 Other migraine, not intractable, without status migrainosus: Secondary | ICD-10-CM

## 2020-06-18 DIAGNOSIS — I10 Essential (primary) hypertension: Secondary | ICD-10-CM

## 2020-06-18 NOTE — Telephone Encounter (Signed)
RX REFILL metFORMIN (GLUCOPHAGE-XR) 500 MG 24 hr tablet [[id number]]  amLODipine (NORVASC) 2.5 MG tablet [[id number]]  irbesartan-hydrochlorothiazide (AVALIDE) 150-12.5 MG tablet [[id number]]  topiramate (TOPAMAX) 50 MG tablet [[id number]   [place] [place] [address] [place] [address] [place] [address]  Phone: [phone] Fax: [phone]  Hours: Not open 24 hours   Patient states he would like a years worth of refills.  Patient saw PCP in May for CPE

## 2020-06-18 NOTE — Telephone Encounter (Signed)
Requested medication (s) are due for refill today: yes  Requested medication (s) are on the active medication list: yes    Future visit scheduled: yes  Notes to clinic:  Patient requesting to have medications refilled for 1 year    Requested Prescriptions  Pending Prescriptions Disp Refills   amLODipine (NORVASC) 2.5 MG tablet 30 tablet 1    Sig: TAKE (1) TABLET BY MOUTH EVERY DAY      Cardiovascular:  Calcium Channel Blockers Passed - 06/18/2020 12:16 PM      Passed - Last BP in normal range    BP Readings from Last 1 Encounters:  03/18/20 110/68          Passed - Valid encounter within last 6 months    Recent Outpatient Visits           3 months ago Annual physical exam   [facility] Birdie Sons, MD   8 months ago Hyperglyceridemia, pure   [facility] Birdie Sons, MD   1 year ago Influenza-like illness   [facility] [place], Wendee Bea[place], [place]   2 years ago [place], [place]   2 years ago Edema, unspecified type   [facility] Birdie Sons, MD       Future Appointments             In 5 days Fisher, Kirstie Peri, MD [facility]              topiramate (TOPAMAX) 50 MG tablet 90 tablet 0    Sig: Take 1 tablet (50 mg total) by mouth daily.      Not Delegated - Neurology: Anticonvulsants - topiramate & zonisamide Failed - 06/18/2020 12:16 PM      Failed - This refill cannot be delegated      Passed - Cr in normal range and within 360 days    Creatinine  Date Value Ref Range Status  10/25/2012 1.41 (H) 0.60 - 1.30 mg/dL Final   Creatinine, Ser  Date Value Ref Range Status  02/21/2020 1.05 0.76 - 1.27 mg/dL Final          Passed - CO2 in normal range and within 360 days    CO2  Date Value Ref Range Status  02/21/2020 22 20 - 29 mmol/L Final   Co2  Date Value Ref Range Status  10/25/2012 24 21 - 32 mmol/L Final           Passed - Valid encounter within last 12 months    Recent Outpatient Visits           3 months ago Annual physical exam   [facility] Birdie Sons, MD   8 months ago Hyperglyceridemia, pure   [facility] Birdie Sons, MD   1 year ago Influenza-like illness   [facility] [place], Wendee Beavers, [place]   2 years ago [place], [place]   2 years ago Edema, unspecified type   [facility] Birdie Sons, MD       Future Appointments             In 5 days Fisher, Kirstie Peri, MD [facility], [facility]              metFORMIN (GLUCOPHAGE-XR) 500 MG 24 hr tablet 30 tablet 1    Sig: TAKE ONE TABLET BY MOUTH ONCE  DAILY.      Endocrinology:  Diabetes - Biguanides Passed - 06/18/2020 12:16 PM      Passed - Cr in normal range and within 360 days    Creatinine  Date Value Ref Range Status  10/25/2012 1.41 (H) 0.60 - 1.30 mg/dL Final   Creatinine, Ser  Date Value Ref Range Status  02/21/2020 1.05 0.76 - 1.27 mg/dL Final          Passed - HBA1C is between 0 and 7.9 and within 180 days    Hgb A1c MFr Bld  Date Value Ref Range Status  02/21/2020 5.7 (H) 4.8 - 5.6 % Final    Comment:             Prediabetes: 5.7 - 6.4          Diabetes: >6.4          Glycemic control for adults with diabetes: <7.0           Passed - eGFR in normal range and within 360 days    EGFR (African American)  Date Value Ref Range Status  10/25/2012 >60  Final   GFR calc Af Amer  Date Value Ref Range Status  02/21/2020 86 >59 mL/min/1.73 Final    Comment:    **[facility] currently reports eGFR in compliance with the current**   recommendations of the [employer]. [facility] will   update reporting as new guidelines are published from the NKF-ASN   Task force.    EGFR (Non-African Amer.)  Date Value Ref Range Status  10/25/2012 55 (L)  Final    Comment:    eGFR values  <22m/min/1.73 m2 may be an indication of chronic kidney disease (CKD). Calculated eGFR is useful in patients with stable renal function. The eGFR calculation will not be reliable in acutely ill patients when serum creatinine is changing rapidly. It is not useful in  patients on dialysis. The eGFR calculation may not be applicable to patients at the low and high extremes of body sizes, pregnant women, and vegetarians.    GFR calc non Af Amer  Date Value Ref Range Status  02/21/2020 75 >59 mL/min/1.73 Final          Passed - Valid encounter within last 6 months    Recent Outpatient Visits           [facility]   8 months ago [facility]glyceridemia, pure   [facility]   1 year ago [place]-like illness   [place]  [place]  2 years ago Edema, unspecified type   [place]       Future Appointments             In 5 days Fisher[facility]              irbesartan-hydrochlorothiazide (AVALIDE) 150-12.5 MG tablet 90 tablet 1    Sig: Take 1 tablet by mouth daily.      Cardiovascular: ARB + Diuretic Combos Passed - 06/18/2020 12:16 PM      Passed - K in normal range and within 180 days    Potassium  Date Value Ref Range Status  02/21/2020 3.5 3.5 - 5.2 mmol/L Final  10/25/2012 3.3 (L) 3.5 - 5.1  mmol/L Final          Passed - Na in normal range and within 180 days    Sodium  Date Value Ref Range Status  02/21/2020 140 134 - 144 mmol/L Final  10/25/2012 142 136 - 145 mmol/L Final          Passed - Cr in normal range and within 180 days    Creatinine  Date Value Ref Range Status  10/25/2012 1.41 (H) 0.60 - 1.30 mg/dL Final   Creatinine, Ser  Date Value Ref Range Status  02/21/2020  1.05 0.76 - 1.27 mg/dL Final          Passed - Ca in normal range and within 180 days    Calcium  Date Value Ref Range Status  02/21/2020 9.3 8.6 - 10.2 mg/dL Final   Calcium, Total  Date Value Ref Range Status  10/25/2012 9.0 8.5 - 10.1 mg/dL Final          Passed - Patient is not pregnant      Passed - Last BP in normal range    BP Readings from Last 1 Encounters:  03/18/20 110/68          Passed - Valid encounter within last 6 months    Recent Outpatient Visits           3 months ago Annual physical exam   [facility] Birdie Sons, MD   8 months ago Hyperglyceridemia, pure   [facility] Birdie Sons, MD   1 year ago Influenza-like illness   [place], [place], [place]   2 years ago [place], [place]   2 years ago Edema, unspecified type   [facility] Birdie Sons, MD       Future Appointments             In 5 days Fisher, Kirstie Peri, MD [facility], [place]

## 2020-06-19 MED ORDER — AMLODIPINE BESYLATE 2.5 MG PO TABS: 2.5000 mg | ORAL_TABLET | Freq: Every day | ORAL | 2 refills | Status: DC

## 2020-06-19 MED ORDER — METFORMIN HCL ER 500 MG PO TB24: 500.0000 mg | ORAL_TABLET | Freq: Every day | ORAL | 2 refills | Status: DC

## 2020-06-19 MED ORDER — IRBESARTAN-HYDROCHLOROTHIAZIDE 150-12.5 MG PO TABS: 1.0000 | ORAL_TABLET | Freq: Every day | ORAL | 3 refills | Status: DC

## 2020-06-23 ENCOUNTER — Other Ambulatory Visit: Payer: Self-pay

## 2020-06-23 ENCOUNTER — Ambulatory Visit (INDEPENDENT_AMBULATORY_CARE_PROVIDER_SITE_OTHER): Payer: BC Managed Care – PPO | Admitting: Family Medicine

## 2020-06-23 ENCOUNTER — Encounter: Payer: Self-pay | Admitting: Family Medicine

## 2020-06-23 VITALS — BP 118/69 | HR 62 | Temp 98.2°F | Wt 212.0 lb

## 2020-06-23 DIAGNOSIS — R7303 Prediabetes: Secondary | ICD-10-CM

## 2020-06-23 DIAGNOSIS — E559 Vitamin D deficiency, unspecified: Secondary | ICD-10-CM | POA: Diagnosis not present

## 2020-06-23 DIAGNOSIS — I10 Essential (primary) hypertension: Secondary | ICD-10-CM | POA: Diagnosis not present

## 2020-06-23 DIAGNOSIS — G43809 Other migraine, not intractable, without status migrainosus: Secondary | ICD-10-CM

## 2020-06-23 MED ORDER — TOPIRAMATE 50 MG PO TABS: ORAL_TABLET | ORAL | 3 refills | Status: DC

## 2020-06-23 MED ORDER — METFORMIN HCL ER 500 MG PO TB24: 500.0000 mg | ORAL_TABLET | Freq: Every day | ORAL | 2 refills | Status: DC

## 2020-06-23 NOTE — Patient Instructions (Signed)
We will be scheduling flu vaccines after Labor Day. Please call our office at [phone] after Labor Day to schedule your flu vaccine.

## 2020-06-23 NOTE — Progress Notes (Signed)
Established patient visit   Patient: Arthur Davis   DOB: 1956/05/21   64 y.o. Male  MRN: [id number] Visit Date: 06/23/2020  Today's healthcare provider: Lelon Huh, MD   Chief Complaint  Patient presents with  . Hypertension   Subjective    HPI  Hypertension, follow-up  BP Readings from Last 3 Encounters:  06/23/20 118/69  03/18/20 110/68  10/24/19 (!) 144/74   Wt Readings from Last 3 Encounters:  06/23/20 212 lb (96.2 kg)  03/18/20 209 lb 12.8 oz (95.2 kg)  10/23/19 220 lb (99.8 kg)     He was last seen for hypertension 3 months ago.  BP at that visit was 110/68. Management since that visit includes decreased irbesartan-HCTZ from 300-12.5  to 150-12.5mg  daily due to relative hypotension and fatigue. He states he has made some changes in diet which he thinks has helped. Has cut back on sweets quite a bit due to mildly elevated A1c.  He reports good compliance with treatment. He is not having side effects.  He is following a Regular diet. He is not exercising. He does not smoke.  Use of agents associated with hypertension: none.   Outside blood pressures are checked daily. Averages in the 130s/60s.  Symptoms: No chest pain No chest pressure  No palpitations No syncope  No dyspnea No orthopnea  No paroxysmal nocturnal dyspnea No lower extremity edema   Pertinent labs: Lab Results  Component Value Date   CHOL 132 02/21/2020   HDL 36 (L) 02/21/2020   LDLCALC 61 02/21/2020   TRIG 215 (H) 02/21/2020   CHOLHDL 2.9 11/22/2017   Lab Results  Component Value Date   NA 140 02/21/2020   K 3.5 02/21/2020   CREATININE 1.05 02/21/2020   GFRNONAA 75 02/21/2020   GFRAA 86 02/21/2020   GLUCOSE 119 (H) 02/21/2020     The 10-year ASCVD risk score Mikey Bussing DC Jr., et al., 2013) is: 10.8%      Medications: Outpatient Medications Prior to Visit  Medication Sig  . amLODipine (NORVASC) 2.5 MG tablet Take 1 tablet (2.5 mg total) by mouth daily.  . Cholecalciferol  (VITAMIN D3) 2000 UNITS TABS Take 1 tablet by mouth daily. Takes 1 tablet 2 or 3 times per week.  . Flaxseed, Linseed, (RA FLAX SEED OIL 1000 PO) Take 1 capsule by mouth daily.  . irbesartan-hydrochlorothiazide (AVALIDE) 150-12.5 MG tablet Take 1 tablet by mouth daily.  . Magnesium Oxide 500 MG (LAX) TABS Take Over The Counter Magnesium Oxide 400 to 600 mg per day (if develop diarrhea - try magnesium glycinate 400 to 600 mg per day)  . metFORMIN (GLUCOPHAGE-XR) 500 MG 24 hr tablet Take 1 tablet (500 mg total) by mouth daily.  . Omega-3 Fatty Acids (FISH OIL CONCENTRATE) 1000 MG CAPS Take 1 capsule by mouth daily.   Marland Kitchen omeprazole (PRILOSEC) 40 MG capsule Take 40 mg by mouth daily.   Marland Kitchen topiramate (TOPAMAX) 50 MG tablet TAKE (1) TABLET BY MOUTH EVERY DAY   No facility-administered medications prior to visit.    Review of Systems  Constitutional: Negative.   Respiratory: Negative.   Cardiovascular: Negative.   Gastrointestinal: Negative.   Musculoskeletal: Negative.   Neurological: Negative.       Objective    BP 118/69 (BP Location: Right Arm)   Pulse 62   Temp 98.2 F (36.8 C)   Wt 212 lb (96.2 kg)   BMI 30.42 kg/m    Physical Exam   General appearance: Obese  male, cooperative and in no acute distress Head: Normocephalic, without obvious abnormality, atraumatic Respiratory: Respirations even and unlabored, normal respiratory rate Extremities: All extremities are intact.  Skin: Skin color, texture, turgor normal. No rashes seen  Psych: Appropriate mood and affect. Neurologic: Mental status: Alert, oriented to person, place, and time, thought content appropriate.   No results found for any visits on 06/23/20.  Assessment & Plan     1. Essential (primary) hypertension Well controlled.  Feeling better since reducing dose of irbasartan-hctz. Continue current medications.    2. Vitamin D deficiency Had cut down to one tablet 2-3 days a week due to elevated levels at 126.0 -  VITAMIN D 25 Hydroxy (Vit-D Deficiency, Fractures)  3. Prediabetes  - Hemoglobin A1c  refill- metFORMIN (GLUCOPHAGE-XR) 500 MG 24 hr tablet; Take 1 tablet (500 mg total) by mouth daily.  Dispense: 90 tablet; Refill: 2  4. Other migraine without status migrainosus, not intractable Well controlled on current regiment of - topiramate (TOPAMAX) 50 MG tablet; TAKE (1) TABLET BY MOUTH EVERY DAY  Dispense: 90 tablet; Refill: 3         The entirety of the information documented in the History of Present Illness, Review of Systems and Physical Exam were personally obtained by me. Portions of this information were initially documented by the CMA and reviewed by me for thoroughness and accuracy.      Lelon Huh, MD  [facility] [phone] (phone) [phone] (fax)  [place]

## 2020-06-24 LAB — VITAMIN D 25 HYDROXY (VIT D DEFICIENCY, FRACTURES): Vit D, 25-Hydroxy: 121 ng/mL — ABNORMAL HIGH (ref 30.0–100.0)

## 2020-06-24 LAB — HEMOGLOBIN A1C
Est. average glucose Bld gHb Est-mCnc: 120 mg/dL
Hgb A1c MFr Bld: 5.8 % — ABNORMAL HIGH (ref 4.8–5.6)

## 2020-08-06 ENCOUNTER — Ambulatory Visit (INDEPENDENT_AMBULATORY_CARE_PROVIDER_SITE_OTHER): Payer: Medicare Other

## 2020-08-06 ENCOUNTER — Other Ambulatory Visit: Payer: Self-pay

## 2020-08-06 DIAGNOSIS — Z23 Encounter for immunization: Secondary | ICD-10-CM | POA: Diagnosis not present

## 2020-08-12 ENCOUNTER — Ambulatory Visit: Payer: BC Managed Care – PPO

## 2020-08-20 ENCOUNTER — Ambulatory Visit: Payer: Self-pay

## 2020-08-27 ENCOUNTER — Other Ambulatory Visit: Payer: Self-pay

## 2020-08-27 ENCOUNTER — Ambulatory Visit (INDEPENDENT_AMBULATORY_CARE_PROVIDER_SITE_OTHER): Payer: Medicare Other

## 2020-08-27 DIAGNOSIS — Z23 Encounter for immunization: Secondary | ICD-10-CM | POA: Diagnosis not present

## 2020-09-23 ENCOUNTER — Ambulatory Visit: Payer: Self-pay | Admitting: *Deleted

## 2020-09-23 NOTE — Telephone Encounter (Signed)
Pt called in and got an appt for other reasons that did not need triaging however at the end of his conversation with the agent he mentioned for the last week he has been waking up feeling like he is smothering.   He uses a CPAP machine but nothing has changed with it or been changed.    He doesn't know what's causing these episodes   He does not happen every night.   Denies chest pain.  He sits up and the smothering feeling resolves himself shortly there after.  We got disconnected as a result of an internet disconnection.   He called back and I let him know to be sure and mention these episodes to Dr. Caryn Section when he sees him next Tuesday.   So no protocol used.   I couldn't find anything that matched his symptoms.   He denied being short of breath or having anxiety.  He was agreeable to this plan.

## 2020-09-29 ENCOUNTER — Other Ambulatory Visit: Payer: Self-pay

## 2020-09-29 ENCOUNTER — Encounter: Payer: Self-pay | Admitting: Family Medicine

## 2020-09-29 ENCOUNTER — Ambulatory Visit (INDEPENDENT_AMBULATORY_CARE_PROVIDER_SITE_OTHER): Payer: Medicare Other | Admitting: Family Medicine

## 2020-09-29 VITALS — BP 110/67 | HR 63 | Temp 98.5°F | Resp 16 | Wt 214.0 lb

## 2020-09-29 DIAGNOSIS — E559 Vitamin D deficiency, unspecified: Secondary | ICD-10-CM

## 2020-09-29 DIAGNOSIS — R0601 Orthopnea: Secondary | ICD-10-CM

## 2020-09-29 DIAGNOSIS — R0609 Other forms of dyspnea: Secondary | ICD-10-CM

## 2020-09-29 DIAGNOSIS — R0600 Dyspnea, unspecified: Secondary | ICD-10-CM

## 2020-09-29 DIAGNOSIS — Z23 Encounter for immunization: Secondary | ICD-10-CM | POA: Diagnosis not present

## 2020-09-29 NOTE — Patient Instructions (Signed)
.   Please review the attached list of medications and notify my office if there are any errors.   The CDC recommends two doses of Shingrix (the shingles vaccine) separated by 2 to 6 months for adults age 65 years and older. I recommend checking with your pharmacy plan regarding coverage for this vaccine.  .    

## 2020-09-29 NOTE — Progress Notes (Signed)
Established patient visit   Patient: Arthur Davis   DOB: 09-11-1956   64 y.o. Male  MRN: [id number] Visit Date: 09/29/2020  Today's healthcare provider: Lelon Huh, MD   Chief Complaint  Patient presents with  . Shortness of Breath   Subjective    Shortness of Breath This is a new problem. Episode onset: 2 weeks ago. The problem has been unchanged. Pertinent negatives include no abdominal pain, chest pain, fever, vomiting or wheezing. Exacerbated by: laying down asleep at night. Treatments tried: CPAP.    Patient reports that he has had 2 episodes within the past 2 weeks where he wakes up in the middle of the night gasping for air. He does wear CPAP which is set to auto-titrate every night. He also notes that he has been more short of breath with exertion the last few months, and feels short of breat when lying down, which is relieved by sitting back up.       Medications: Outpatient Medications Prior to Visit  Medication Sig  . amLODipine (NORVASC) 2.5 MG tablet Take 1 tablet (2.5 mg total) by mouth daily.  . Cholecalciferol (VITAMIN D3) 2000 UNITS TABS Take 1 tablet by mouth daily. Takes 1 tablet 2 or 3 times per week.  . Flaxseed, Linseed, (RA FLAX SEED OIL 1000 PO) Take 1 capsule by mouth daily.  Marland Kitchen GRAPE SEED EXTRACT PO Take by mouth.  . irbesartan-hydrochlorothiazide (AVALIDE) 150-12.5 MG tablet Take 1 tablet by mouth daily.  . Magnesium Oxide 500 MG (LAX) TABS Take Over The Counter Magnesium Oxide 400 to 600 mg per day (if develop diarrhea - try magnesium glycinate 400 to 600 mg per day)  . metFORMIN (GLUCOPHAGE-XR) 500 MG 24 hr tablet Take 1 tablet (500 mg total) by mouth daily.  . Omega-3 Fatty Acids (FISH OIL CONCENTRATE) 1000 MG CAPS Take 1 capsule by mouth daily.   Marland Kitchen omeprazole (PRILOSEC) 40 MG capsule Take 40 mg by mouth daily.   Marland Kitchen topiramate (TOPAMAX) 50 MG tablet TAKE (1) TABLET BY MOUTH EVERY DAY   No facility-administered medications prior to visit.     Review of Systems  Constitutional: Negative for appetite change, chills and fever.  Respiratory: Positive for shortness of breath. Negative for chest tightness and wheezing.   Cardiovascular: Negative for chest pain and palpitations.  Gastrointestinal: Negative for abdominal pain, nausea and vomiting.      Objective    BP 110/67 (BP Location: Right Arm, Patient Position: Sitting, Cuff Size: Large)   Pulse 63   Temp 98.5 F (36.9 C) (Oral)   Resp 16   Wt 214 lb (97.1 kg)   SpO2 98% Comment: room air  BMI 30.71 kg/m    Physical Exam   General: Appearance:    Obese male in no acute distress  Eyes:    PERRL, conjunctiva/corneas clear, EOM's intact       Lungs:     Clear to auscultation bilaterally, respirations unlabored  Heart:    Normal heart rate. Normal rhythm. No murmurs, rubs, or gallops.   MS:   All extremities are intact.   Neurologic:   Awake, alert, oriented x 3. No apparent focal neurological           defect.         Assessment & Plan     1. Orthopnea  - B Nat Peptide - CBC  2. Dyspnea on exertion  - B Nat Peptide - CBC  3. Need for vaccination against  Streptococcus pneumoniae  - Pneumococcal polysaccharide vaccine 23-valent greater than or equal to 2yo subcutaneous/IM  4. Vitamin D deficiency  - VITAMIN D 25 Hydroxy (Vit-D Deficiency, Fractures)       The entirety of the information documented in the History of Present Illness, Review of Systems and Physical Exam were personally obtained by me. Portions of this information were initially documented by the CMA and reviewed by me for thoroughness and accuracy.      Lelon Huh, MD  [facility] [phone] (phone) [phone] (fax)  [place]

## 2020-10-02 LAB — CBC
Hematocrit: 44.4 % (ref 37.5–51.0)
Hemoglobin: 16 g/dL (ref 13.0–17.7)
MCH: 32.6 pg (ref 26.6–33.0)
MCHC: 36 g/dL — ABNORMAL HIGH (ref 31.5–35.7)
MCV: 90 fL (ref 79–97)
Platelets: 133 10*3/uL — ABNORMAL LOW (ref 150–450)
RBC: 4.91 x10E6/uL (ref 4.14–5.80)
RDW: 12.4 % (ref 11.6–15.4)
WBC: 6.2 10*3/uL (ref 3.4–10.8)

## 2020-10-02 LAB — BRAIN NATRIURETIC PEPTIDE: BNP: 6.6 pg/mL (ref 0.0–100.0)

## 2020-10-02 LAB — VITAMIN D 25 HYDROXY (VIT D DEFICIENCY, FRACTURES): Vit D, 25-Hydroxy: 56.7 ng/mL (ref 30.0–100.0)

## 2020-11-05 ENCOUNTER — Ambulatory Visit: Payer: Self-pay | Admitting: *Deleted

## 2020-11-05 NOTE — Telephone Encounter (Signed)
Virtual scheduled with you tomorrow  Thanks

## 2020-11-05 NOTE — Telephone Encounter (Signed)
Patient had exposure to COVID- granddaughter.Patient did home test 12/29 that was positive. Patient's concern is cough not getting better. Call to office for appointment- Epic closed and unable to make appointment- message sent- phones are still not working- office to call patient for virtual visit.   Reason for Disposition . HIGH RISK for severe COVID complications (e.g., age > 64 years, obesity with BMI > 25, pregnant, chronic lung disease or other chronic medical condition)  (Exception: Already seen by PCP and no new or worsening symptoms.)  Answer Assessment - Initial Assessment Questions 1. COVID-19 DIAGNOSIS: "Who made your COVID-19 diagnosis?" "Was it confirmed by a positive lab test?" If not diagnosed by a HCP, ask "Are there lots of cases (community spread) where you live?" Note: See public health department website, if unsure.     Home test- + COVID 12/29 2. COVID-19 EXPOSURE: "Was there any known exposure to COVID before the symptoms began?" CDC Definition of close contact: within 6 feet (2 meters) for a total of 15 minutes or more over a 24-hour period.      Exposure to grandaughter 3. ONSET: "When did the COVID-19 symptoms start?"      12/29 4. WORST SYMPTOM: "What is your worst symptom?" (e.g., cough, fever, shortness of breath, muscle aches)     cough 5. COUGH: "Do you have a cough?" If Yes, ask: "How bad is the cough?"       Yes- spells of coughing, laying down causes cough 6. FEVER: "Do you have a fever?" If Yes, ask: "What is your temperature, how was it measured, and when did it start?"     no 7. RESPIRATORY STATUS: "Describe your breathing?" (e.g., shortness of breath, wheezing, unable to speak)      Sometimes it is shallow 8. BETTER-SAME-WORSE: "Are you getting better, staying the same or getting worse compared to yesterday?"  If getting worse, ask, "In what way?"     Worse- coughing 9. HIGH RISK DISEASE: "Do you have any chronic medical problems?" (e.g., asthma, heart or  lung disease, weak immune system, obesity, etc.)     Age, high blood pressure, diabetes 10. VACCINE: "Have you gotten the COVID-19 vaccine?" If Yes ask: "Which one, how many shots, when did you get it?"       Yes- pfizer  11. PREGNANCY: "Is there any chance you are pregnant?" "When was your last menstrual period?"       n/a 12. OTHER SYMPTOMS: "Do you have any other symptoms?"  (e.g., chills, fatigue, headache, loss of smell or taste, muscle pain, sore throat; new loss of smell or taste especially support the diagnosis of COVID-19)       Muscle pain  Protocols used: CORONAVIRUS (COVID-19) DIAGNOSED OR SUSPECTED-A-AH

## 2020-11-06 ENCOUNTER — Telehealth (INDEPENDENT_AMBULATORY_CARE_PROVIDER_SITE_OTHER): Payer: Medicare Other | Admitting: Physician Assistant

## 2020-11-06 ENCOUNTER — Encounter: Payer: Self-pay | Admitting: Physician Assistant

## 2020-11-06 DIAGNOSIS — J189 Pneumonia, unspecified organism: Secondary | ICD-10-CM

## 2020-11-06 DIAGNOSIS — U071 COVID-19: Secondary | ICD-10-CM | POA: Diagnosis not present

## 2020-11-06 MED ORDER — BENZONATATE 200 MG PO CAPS: 200.0000 mg | ORAL_CAPSULE | Freq: Two times a day (BID) | ORAL | 0 refills | Status: DC | PRN

## 2020-11-06 MED ORDER — ALBUTEROL SULFATE HFA 108 (90 BASE) MCG/ACT IN AERS: 2.0000 | INHALATION_SPRAY | Freq: Four times a day (QID) | RESPIRATORY_TRACT | 0 refills | Status: DC | PRN

## 2020-11-06 MED ORDER — AZITHROMYCIN 250 MG PO TABS: ORAL_TABLET | ORAL | 0 refills | Status: DC

## 2020-11-06 NOTE — Progress Notes (Signed)
MyChart Video Visit    Virtual Visit via Video Note   This visit type was conducted due to national recommendations for restrictions regarding the COVID-19 Pandemic (e.g. social distancing) in an effort to limit this patient's exposure and mitigate transmission in our community. This patient is at least at moderate risk for complications without adequate follow up. This format is felt to be most appropriate for this patient at this time. Physical exam was limited by quality of the video and audio technology used for the visit.   Patient location: Home Provider location: Home office in [place]  I discussed the limitations of evaluation and management by telemedicine and the availability of in person appointments. The patient expressed understanding and agreed to proceed.  Patient: Arthur Davis   DOB: 08-08-1956   65 y.o. Male  MRN: [id number] Visit Date: 11/06/2020  Today's healthcare provider: Mar Daring, PA-C   Chief Complaint  Patient presents with  . Covid Positive   Subjective    HPI  Arthur Davis is a 65 yr old male that presents today via mychart video visit for follow up of covid 80. He tested positive on 10/28/20. Symptoms started on 10/25/20. He reports he had started to feel better, but over the last 2-3 days he has noticed more heaviness in his chest and increased cough. He denies SOB or difficulty breathing.   Patient Active Problem List   Diagnosis Date Noted  . Prediabetes 02/19/2020  . Foreign body in esophagus   . Osteoarthritis of left knee 10/30/2017  . GERD (gastroesophageal reflux disease) 10/13/2015  . Gallbladder sludge 10/13/2015  . Right-sided back pain 09/28/2015  . Obesity (BMI 30-39.9) 09/10/2015  . OSA on CPAP 09/10/2015  . BPH (benign prostatic hyperplasia) 07/13/2015  . Dysphagia 07/13/2015  . H/O adenomatous polyp of colon 07/13/2015  . Leg pain 07/13/2015  . Bad memory 07/13/2015  . Disturbance of skin sensation  07/13/2015  . Seborrheic keratosis 07/13/2015  . Vitamin D deficiency 07/13/2015  . ADD (attention deficit hyperactivity disorder, inattentive type) 09/02/2009  . Benign paroxysmal vertigo 07/27/2009  . Hyperglyceridemia, pure 03/23/2006  . Headache, migraine 03/22/2006  . Essential (primary) hypertension 03/20/2000   Past Medical History:  Diagnosis Date  . History of colonic polyps 05/09/2006  . Hypertension 03/20/2000  . Pre-diabetes       Medications: Outpatient Medications Prior to Visit  Medication Sig  . amLODipine (NORVASC) 2.5 MG tablet Take 1 tablet (2.5 mg total) by mouth daily.  . Cholecalciferol (VITAMIN D3) 2000 UNITS TABS Take 1 tablet by mouth daily. Takes 1 tablet 2 or 3 times per week.  . Flaxseed, Linseed, (RA FLAX SEED OIL 1000 PO) Take 1 capsule by mouth daily.  Marland Kitchen GRAPE SEED EXTRACT PO Take by mouth.  . irbesartan-hydrochlorothiazide (AVALIDE) 150-12.5 MG tablet Take 1 tablet by mouth daily.  . Magnesium Oxide 500 MG (LAX) TABS Take Over The Counter Magnesium Oxide 400 to 600 mg per day (if develop diarrhea - try magnesium glycinate 400 to 600 mg per day)  . metFORMIN (GLUCOPHAGE-XR) 500 MG 24 hr tablet Take 1 tablet (500 mg total) by mouth daily.  . Omega-3 Fatty Acids (FISH OIL CONCENTRATE) 1000 MG CAPS Take 1 capsule by mouth daily.   Marland Kitchen omeprazole (PRILOSEC) 40 MG capsule Take 40 mg by mouth daily.   Marland Kitchen topiramate (TOPAMAX) 50 MG tablet TAKE (1) TABLET BY MOUTH EVERY DAY   No facility-administered medications prior to visit.    Review  of Systems  Constitutional: Negative for chills and fever.  HENT: Positive for congestion. Negative for postnasal drip, rhinorrhea, sinus pressure, sinus pain and sore throat.   Respiratory: Positive for cough and chest tightness. Negative for shortness of breath and wheezing.   Cardiovascular: Negative for chest pain, palpitations and leg swelling.  Gastrointestinal: Negative for abdominal pain.  Neurological: Negative for  dizziness and headaches.    Last CBC Lab Results  Component Value Date   WBC 6.2 10/01/2020   HGB 16.0 10/01/2020   HCT 44.4 10/01/2020   MCV 90 10/01/2020   MCH 32.6 10/01/2020   RDW 12.4 10/01/2020   PLT 133 (L) 81/82/9937   Last metabolic panel Lab Results  Component Value Date   GLUCOSE 119 (H) 02/21/2020   NA 140 02/21/2020   K 3.5 02/21/2020   CL 102 02/21/2020   CO2 22 02/21/2020   BUN 16 02/21/2020   CREATININE 1.05 02/21/2020   GFRNONAA 75 02/21/2020   GFRAA 86 02/21/2020   CALCIUM 9.3 02/21/2020   PROT 6.9 02/21/2020   ALBUMIN 4.9 (H) 02/21/2020   LABGLOB 2.0 02/21/2020   AGRATIO 2.5 (H) 02/21/2020   BILITOT 1.1 02/21/2020   ALKPHOS 67 02/21/2020   AST 18 02/21/2020   ALT 20 02/21/2020   ANIONGAP 10 01/20/2018      Objective    There were no vitals taken for this visit. BP Readings from Last 3 Encounters:  09/29/20 110/67  06/23/20 118/69  03/18/20 110/68   Wt Readings from Last 3 Encounters:  09/29/20 214 lb (97.1 kg)  06/23/20 212 lb (96.2 kg)  03/18/20 209 lb 12.8 oz (95.2 kg)      Physical Exam Vitals reviewed.  Constitutional:      General: He is not in acute distress.    Appearance: He is well-developed and well-nourished.  HENT:     Head: Normocephalic and atraumatic.  Eyes:     Extraocular Movements: EOM normal.     Conjunctiva/sclera: Conjunctivae normal.  Pulmonary:     Effort: Pulmonary effort is normal. No respiratory distress (able to speak in full sentences without issue, no cough heard).  Musculoskeletal:     Cervical back: Normal range of motion and neck supple.  Psychiatric:        Mood and Affect: Mood and affect normal.        Behavior: Behavior normal.        Thought Content: Thought content normal.        Judgment: Judgment normal.        Assessment & Plan     1. Atypical pneumonia Suspect possible secondary bacterial infection following covid 19 infection as he had classic symptoms of second sickening.  Will treat for atypical pneumonia as below with Zpak. Return precautions verbally discussed. Call if worsening. May consider CXR if not improving. May add steroid if needed. Holding off currently due to patient being diabetic.  - azithromycin (ZITHROMAX) 250 MG tablet; Take 2 tablets PO on day one, and one tablet PO daily thereafter until completed.  Dispense: 6 tablet; Refill: 0  2. COVID-19 On day 12 from symptom onset. Add tessalon perles and albuterol. Call if worsening.  - benzonatate (TESSALON) 200 MG capsule; Take 1 capsule (200 mg total) by mouth 2 (two) times daily as needed for cough.  Dispense: 20 capsule; Refill: 0 - albuterol (VENTOLIN HFA) 108 (90 Base) MCG/ACT inhaler; Inhale 2 puffs into the lungs every 6 (six) hours as needed for wheezing or shortness of  breath.  Dispense: 8 g; Refill: 0   No follow-ups on file.     I discussed the assessment and treatment plan with the patient. The patient was provided an opportunity to ask questions and all were answered. The patient agreed with the plan and demonstrated an understanding of the instructions.   The patient was advised to call back or seek an in-person evaluation if the symptoms worsen or if the condition fails to improve as anticipated.  I provided 12 minutes of face-to-face time during this encounter via MyChart Video enabled encounter.  Reynolds Bowl, PA-C, have reviewed all documentation for this visit. The documentation on 11/06/20 for the exam, diagnosis, procedures, and orders are all accurate and complete.  [facility] [phone] (phone) [phone] (fax)  [place]

## 2020-11-09 ENCOUNTER — Telehealth: Payer: Self-pay | Admitting: Physician Assistant

## 2021-01-15 ENCOUNTER — Ambulatory Visit (INDEPENDENT_AMBULATORY_CARE_PROVIDER_SITE_OTHER): Payer: Medicare Other | Admitting: Family Medicine

## 2021-01-15 ENCOUNTER — Other Ambulatory Visit: Payer: Self-pay

## 2021-01-15 VITALS — BP 125/71 | HR 58 | Temp 99.2°F | Ht 69.0 in | Wt 212.8 lb

## 2021-01-15 DIAGNOSIS — E559 Vitamin D deficiency, unspecified: Secondary | ICD-10-CM | POA: Diagnosis not present

## 2021-01-15 DIAGNOSIS — E781 Pure hyperglyceridemia: Secondary | ICD-10-CM

## 2021-01-15 DIAGNOSIS — G43809 Other migraine, not intractable, without status migrainosus: Secondary | ICD-10-CM

## 2021-01-15 DIAGNOSIS — R7303 Prediabetes: Secondary | ICD-10-CM

## 2021-01-15 DIAGNOSIS — I10 Essential (primary) hypertension: Secondary | ICD-10-CM

## 2021-01-15 DIAGNOSIS — Z0000 Encounter for general adult medical examination without abnormal findings: Secondary | ICD-10-CM

## 2021-01-15 DIAGNOSIS — G4733 Obstructive sleep apnea (adult) (pediatric): Secondary | ICD-10-CM | POA: Diagnosis not present

## 2021-01-15 DIAGNOSIS — Z9989 Dependence on other enabling machines and devices: Secondary | ICD-10-CM

## 2021-01-15 DIAGNOSIS — Z125 Encounter for screening for malignant neoplasm of prostate: Secondary | ICD-10-CM

## 2021-01-15 MED ORDER — TOPIRAMATE 50 MG PO TABS: 50.0000 mg | ORAL_TABLET | Freq: Three times a day (TID) | ORAL | 1 refills | Status: DC

## 2021-01-15 NOTE — Progress Notes (Signed)
Medicare Initial Preventative Physical Exam    Patient: Arthur Davis, Male    DOB: 15-May-1956, 65 y.o.   MRN: [id number] Visit Date: 01/15/2021  Today's Provider: Lelon Huh, MD   Subjective:   No chief complaint on file.   Medicare Initial Preventative Physical Exam Arthur Davis is a 65 y.o. male who presents today for his Initial Preventative Physical Exam.   HPI Arthur Davis follows a regular diet. Tiffany currently exercises once a week for 30 min (walking). In general Ruby feels fairly well (feels less ambitious lately) and is sleeping fairly well. Aside from physical he says he is having more headaches-taking generic topamax.     Social History   Socioeconomic History  . Marital status: Married    Spouse name: Not on file  . Number of children: 5  . Years of education: Not on file  . Highest education level: Not on file  Occupational History  . Occupation: HVAC work   Tobacco Use  . Smoking status: Never Smoker  . Smokeless tobacco: Never Used  Vaping Use  . Vaping Use: Never used  Substance and Sexual Activity  . Alcohol use: Yes    Alcohol/week: 0.0 standard drinks    Comment: rare  . Drug use: No  . Sexual activity: Not on file  Other Topics Concern  . Not on file  Social History Narrative  . Not on file   Social Determinants of Health   Financial Resource Strain: Not on file  Food Insecurity: Not on file  Transportation Needs: Not on file  Physical Activity: Not on file  Stress: Not on file  Social Connections: Not on file  Intimate Partner Violence: Not on file    Past Medical History:  Diagnosis Date  . History of colonic polyps 05/09/2006  . Hypertension 03/20/2000  . Pre-diabetes      Patient Active Problem List   Diagnosis Date Noted  . Prediabetes 02/19/2020  . Foreign body in esophagus   . Osteoarthritis of left knee 10/30/2017  . GERD (gastroesophageal reflux disease) 10/13/2015  . Gallbladder sludge 10/13/2015  . Right-sided  back pain 09/28/2015  . Obesity (BMI 30-39.9) 09/10/2015  . OSA on CPAP 09/10/2015  . BPH (benign prostatic hyperplasia) 07/13/2015  . Dysphagia 07/13/2015  . H/O adenomatous polyp of colon 07/13/2015  . Leg pain 07/13/2015  . Bad memory 07/13/2015  . Disturbance of skin sensation 07/13/2015  . Seborrheic keratosis 07/13/2015  . Vitamin D deficiency 07/13/2015  . ADD (attention deficit hyperactivity disorder, inattentive type) 09/02/2009  . Benign paroxysmal vertigo 07/27/2009  . Hyperglyceridemia, pure 03/23/2006  . Headache, migraine 03/22/2006  . Essential (primary) hypertension 03/20/2000    Past Surgical History:  Procedure Laterality Date  . APPENDECTOMY  1960  . BRAIN SURGERY  1960's   Removed pressure from brain caused by head injury  . ESOPHAGOGASTRODUODENOSCOPY N/A 10/23/2019   Procedure: ESOPHAGOGASTRODUODENOSCOPY (EGD);  Surgeon: Lucilla Lame, MD;  Location[facility] ENDOSCOPY;  Service: Endoscopy;  Laterality: N/A;  . nasal abscess  1960   no information provider  . TONSILLECTOMY AND ADENOIDECTOMY  1960  . UPPER GI ENDOSCOPY  02/20/12   [facility], Normal Esophagus, Nornal Stomach, Normal duodenum, Dr. Candace Cruise; Dilated for dysphagia  . UPPER GI ENDOSCOPY  09/16/2013   Dr. Tiffany Kocher; Changes suspicious for eosinophilic esophaigitis. Normal stomach and duodenum  . VASECTOMY  1990    His family history includes Diabetes in his brother; Heart attack in his brother; Heart disease in his brother;  Hypertension in his mother.   Current Outpatient Medications:  .  albuterol (VENTOLIN HFA) 108 (90 Base) MCG/ACT inhaler, Inhale 2 puffs into the lungs every 6 (six) hours as needed for wheezing or shortness of breath., Disp: 8 g, Rfl: 0 .  amLODipine (NORVASC) 2.5 MG tablet, Take 1 tablet (2.5 mg total) by mouth daily., Disp: 90 tablet, Rfl: 2 .  azithromycin (ZITHROMAX) 250 MG tablet, Take 2 tablets PO on day one, and one tablet PO daily thereafter until completed., Disp: 6 tablet, Rfl: 0 .   benzonatate (TESSALON) 200 MG capsule, Take 1 capsule (200 mg total) by mouth 2 (two) times daily as needed for cough., Disp: 20 capsule, Rfl: 0 .  Cholecalciferol (VITAMIN D3) 2000 UNITS TABS, Take 1 tablet by mouth daily. Takes 1 tablet 2 or 3 times per week., Disp: , Rfl:  .  Flaxseed, Linseed, (RA FLAX SEED OIL 1000 PO), Take 1 capsule by mouth daily., Disp: , Rfl:  .  GRAPE SEED EXTRACT PO, Take by mouth., Disp: , Rfl:  .  irbesartan-hydrochlorothiazide (AVALIDE) 150-12.5 MG tablet, Take 1 tablet by mouth daily., Disp: 90 tablet, Rfl: 3 .  Magnesium Oxide 500 MG (LAX) TABS, Take Over The Counter Magnesium Oxide 400 to 600 mg per day (if develop diarrhea - try magnesium glycinate 400 to 600 mg per day), Disp: , Rfl:  .  metFORMIN (GLUCOPHAGE-XR) 500 MG 24 hr tablet, Take 1 tablet (500 mg total) by mouth daily., Disp: 90 tablet, Rfl: 2 .  Omega-3 Fatty Acids (FISH OIL CONCENTRATE) 1000 MG CAPS, Take 1 capsule by mouth daily. , Disp: , Rfl:  .  omeprazole (PRILOSEC) 40 MG capsule, Take 40 mg by mouth daily. , Disp: , Rfl:  .  topiramate (TOPAMAX) 50 MG tablet, TAKE (1) TABLET BY MOUTH EVERY DAY, Disp: 90 tablet, Rfl: 3   Patient Care Team: Birdie Sons, MD as PCP - General (Family Medicine) Rockey Situ Kathlene November, MD as Consulting Physician (Cardiology)  Review of Systems  Constitutional: Positive for fatigue.  Gastrointestinal: Positive for abdominal pain.  Endocrine: Positive for cold intolerance.  Neurological: Positive for headaches.  Psychiatric/Behavioral: Positive for dysphoric mood.  All other systems reviewed and are negative.      Objective:    Vitals: BP 125/71 (BP Location: Right Arm, Patient Position: Sitting, Cuff Size: Normal)   Pulse (!) 58   Temp 99.2 F (37.3 C) (Temporal)   Ht 5\' 9"  (1.753 m)   Wt 212 lb 12.8 oz (96.5 kg)   SpO2 98%   BMI 31.43 kg/m  No exam data present Physical Exam  General Appearance:    Obese male. Alert, cooperative, in no acute  distress, appears stated age  Head:    Normocephalic, without obvious abnormality, atraumatic  Eyes:    PERRL, conjunctiva/corneas clear, EOM's intact, fundi    benign, both eyes       Ears:    Normal TM's and external ear canals, both ears  Neck:   Supple, symmetrical, trachea midline, no adenopathy;       thyroid:  No enlargement/tenderness/nodules; no carotid   bruit or JVD  Back:     Symmetric, no curvature, ROM normal, no CVA tenderness  Lungs:     Clear to auscultation bilaterally, respirations unlabored  Chest wall:    No tenderness or deformity  Heart:    Bradycardic. Normal rhythm. No murmurs, rubs, or gallops.  S1 and S2 normal  Abdomen:     Soft, non-tender, bowel  sounds active all four quadrants,    no masses, no organomegaly  Genitalia:    deferred  Rectal:    deferred  Extremities:   All extremities are intact. No cyanosis or edema  Pulses:   2+ and symmetric all extremities  Skin:   Skin color, texture, turgor normal, no rashes or lesions  Lymph nodes:   Cervical, supraclavicular, and axillary nodes normal  Neurologic:   CNII-XII intact. Normal strength, sensation and reflexes      throughout     Activities of Daily Living In your present state of health, do you have any difficulty performing the following activities: 03/18/2020  Hearing? N  Vision? N  Difficulty concentrating or making decisions? N  Walking or climbing stairs? N  Dressing or bathing? N  Doing errands, shopping? N  Some recent data might be hidden    Fall Risk Assessment Fall Risk  03/18/2020  Falls in the past year? 0  Number falls in past yr: 0  Injury with Fall? 0  Follow up Falls evaluation completed     Depression Screen PHQ 2/9 Scores 03/18/2020 11/22/2017 11/22/2016 07/13/2015  PHQ - 2 Score 1 0 0 0  PHQ- 9 Score 8 0 3 5    No flowsheet data found.    Assessment & Plan:    Initial Preventative Physical Exam  Reviewed patient's Family Medical History Reviewed and updated list of  patient's medical providers Assessment of cognitive impairment was done Assessed patient's functional ability Established a written schedule for health screening [place] Completed and Reviewed  Exercise Activities and Dietary recommendations Goals   None     Immunization History  Administered Date(s) Administered  . Fluad Quad(high Dose 65+) 08/06/2020  . Influenza Inj Mdck Quad With Preservative 10/07/2019  . Influenza,inj,Quad PF,6+ Mos 11/22/2016, 07/05/2017  . PFIZER(Purple Top)SARS-COV-2 Vaccination 01/05/2020, 01/26/2020, 08/27/2020  . Pneumococcal Polysaccharide-23 09/29/2020  . Tdap 05/12/2012  . Zoster 07/13/2015    Health Maintenance  Topic Date Due  . HIV Screening  Never done  . COVID-19 Vaccine (4 - Booster for Pfizer series) 02/25/2021  . PNA vac Low Risk Adult (2 of 2 - PCV13) 09/29/2021  . COLONOSCOPY (Pts 45-63yrs Insurance coverage will need to be confirmed)  02/14/2022  . TETANUS/TDAP  05/12/2022  . INFLUENZA VACCINE  Completed  . Hepatitis C Screening  Completed  . HPV VACCINES  Aged Out     Discussed health benefits of physical activity, and encouraged him to engage in regular exercise appropriate for his age and condition.   1. Welcome to Medicare preventive visit  - EKG 12-Lead  2. Prediabetes  - Hemoglobin A1c  3. OSA on CPAP   4. Vitamin D deficiency Lab Results  Component Value Date   VD25OH 56.7 10/01/2020    5. Essential (primary) hypertension Well controlled.  Continue current medications.    6. Other migraine without status migrainosus, not intractable He has been taking 1/2 tablet twice a day which is longer effective. Will increase to - topiramate (TOPAMAX) 50 MG tablet; Take 1 tablet (50 mg total) by mouth 3 (three) times daily.  Dispense: 180 tablet; Refill: 1  7. Prostate cancer screening  - PSA  8. Hyperglyceridemia, pure  - Comprehensive metabolic panel - Lipid panel       Lelon Huh, MD  [facility] [phone] (phone) [phone] (fax)  [place]

## 2021-01-20 LAB — COMPREHENSIVE METABOLIC PANEL
ALT: 36 IU/L (ref 0–44)
AST: 29 IU/L (ref 0–40)
Albumin/Globulin Ratio: 3.1 — ABNORMAL HIGH (ref 1.2–2.2)
Albumin: 5 g/dL — ABNORMAL HIGH (ref 3.8–4.8)
Alkaline Phosphatase: 64 IU/L (ref 44–121)
BUN/Creatinine Ratio: 10 (ref 10–24)
BUN: 12 mg/dL (ref 8–27)
Bilirubin Total: 1.6 mg/dL — ABNORMAL HIGH (ref 0.0–1.2)
CO2: 23 mmol/L (ref 20–29)
Calcium: 9.6 mg/dL (ref 8.6–10.2)
Chloride: 99 mmol/L (ref 96–106)
Creatinine, Ser: 1.16 mg/dL (ref 0.76–1.27)
Globulin, Total: 1.6 g/dL (ref 1.5–4.5)
Glucose: 122 mg/dL — ABNORMAL HIGH (ref 65–99)
Potassium: 4 mmol/L (ref 3.5–5.2)
Sodium: 142 mmol/L (ref 134–144)
Total Protein: 6.6 g/dL (ref 6.0–8.5)
eGFR: 70 mL/min/{1.73_m2} (ref >59)

## 2021-01-20 LAB — HEMOGLOBIN A1C
Est. average glucose Bld gHb Est-mCnc: 108 mg/dL
Hgb A1c MFr Bld: 5.4 % (ref 4.8–5.6)

## 2021-01-20 LAB — PSA: Prostate Specific Ag, Serum: 3.1 ng/mL (ref 0.0–4.0)

## 2021-01-20 LAB — LIPID PANEL
Chol/HDL Ratio: 3.8 ratio (ref 0.0–5.0)
Cholesterol, Total: 173 mg/dL (ref 100–199)
HDL: 46 mg/dL (ref >39)
LDL Chol Calc (NIH): 107 mg/dL — ABNORMAL HIGH (ref 0–99)
Triglycerides: 109 mg/dL (ref 0–149)
VLDL Cholesterol Cal: 20 mg/dL (ref 5–40)

## 2021-03-06 ENCOUNTER — Emergency Department
Admission: EM | Admit: 2021-03-06 | Discharge: 2021-03-06 | Disposition: A | Discharge status: Home / Self Care | Payer: Medicare Other | Attending: Emergency Medicine | Admitting: Emergency Medicine

## 2021-03-06 ENCOUNTER — Other Ambulatory Visit: Payer: Self-pay

## 2021-03-06 DIAGNOSIS — Z79899 Other long term (current) drug therapy: Secondary | ICD-10-CM | POA: Insufficient documentation

## 2021-03-06 DIAGNOSIS — X58XXXA Exposure to other specified factors, initial encounter: Secondary | ICD-10-CM | POA: Diagnosis not present

## 2021-03-06 DIAGNOSIS — K222 Esophageal obstruction: Secondary | ICD-10-CM

## 2021-03-06 DIAGNOSIS — I10 Essential (primary) hypertension: Secondary | ICD-10-CM | POA: Diagnosis not present

## 2021-03-06 DIAGNOSIS — T18128A Food in esophagus causing other injury, initial encounter: Secondary | ICD-10-CM | POA: Diagnosis present

## 2021-03-06 MED ORDER — GLUCAGON HCL RDNA (DIAGNOSTIC) 1 MG IJ SOLR: 1.0000 mg | Freq: Once | INTRAMUSCULAR | Status: DC

## 2021-03-06 MED ORDER — OMEPRAZOLE 40 MG PO CPDR: 40.0000 mg | DELAYED_RELEASE_CAPSULE | Freq: Every day | ORAL | 0 refills | Status: DC

## 2021-03-06 NOTE — Discharge Instructions (Addendum)
You were seen today for food impaction in the esophagus.  This eventually passed on its own.  I have given you prescription for omeprazole 40 mg daily, please take this as prescribed.  Have given you referral to Dr. Haig Prophet who can see you for further evaluation of your esophageal stricture.

## 2021-03-06 NOTE — ED Provider Notes (Signed)
[facility] Emergency Department Provider Note ____________________________________________  Time seen: 1640  I have reviewed the triage vital signs and the nursing notes.  HISTORY  Chief Complaint  Foreign Body   HPI Arthur Davis is a 65 y.o. male presents to the ER today with complaint of food impaction in the esophagus.  He reports he was eating broccoli at lunch and feels like it is stuck in his esophagus, behind his sternum.  He reports it is very difficult to swallow anything.  He reports trying to drink water and coke earlier to get it to go down but it has not moved.  He is spitting up saliva into a clear cup because it hurts too much to swallow.  He does report some epigastric pain but denies constipation, diarrhea or blood in his stool.  He reports history of esophageal stricture with prior food impaction 10/2019, underwent upper GI with successful removal of foreign body by Dr. Allen Norris.  Upper GI at that time showed:  Food in the lower third of the esophagus. Removal was successful. - Normal stomach. - Normal examined duodenum.  He is prescribed omeprazole but reports he does not take this on a regular basis.  Past Medical History:  Diagnosis Date  . History of colonic polyps 05/09/2006  . Hypertension 03/20/2000  . Pre-diabetes     Patient Active Problem List   Diagnosis Date Noted  . Prediabetes 02/19/2020  . Foreign body in esophagus   . Osteoarthritis of left knee 10/30/2017  . GERD (gastroesophageal reflux disease) 10/13/2015  . Gallbladder sludge 10/13/2015  . Right-sided back pain 09/28/2015  . Obesity (BMI 30-39.9) 09/10/2015  . OSA on CPAP 09/10/2015  . BPH (benign prostatic hyperplasia) 07/13/2015  . Dysphagia 07/13/2015  . H/O adenomatous polyp of colon 07/13/2015  . Leg pain 07/13/2015  . Bad memory 07/13/2015  . Disturbance of skin sensation 07/13/2015  . Seborrheic keratosis 07/13/2015  . Vitamin D deficiency 07/13/2015  .  ADD (attention deficit hyperactivity disorder, inattentive type) 09/02/2009  . Benign paroxysmal vertigo 07/27/2009  . Hyperglyceridemia, pure 03/23/2006  . Headache, migraine 03/22/2006  . Essential (primary) hypertension 03/20/2000    Past Surgical History:  Procedure Laterality Date  . APPENDECTOMY  1960  . BRAIN SURGERY  1960's   Removed pressure from brain caused by head injury  . ESOPHAGOGASTRODUODENOSCOPY N/A 10/23/2019   Procedure: ESOPHAGOGASTRODUODENOSCOPY (EGD);  Surgeon: Lucilla Lame, MD;  Location: [facility] ENDOSCOPY;  Service: Endoscopy;  Laterality: N/A;  . nasal abscess  1960   no information provider  . TONSILLECTOMY AND ADENOIDECTOMY  1960  . UPPER GI ENDOSCOPY  02/20/12   [facility], Normal Esophagus, Nornal Stomach, Normal duodenum, Dr. Candace Cruise; Dilated for dysphagia  . UPPER GI ENDOSCOPY  09/16/2013   Dr. Tiffany Kocher; Changes suspicious for eosinophilic esophaigitis. Normal stomach and duodenum  . VASECTOMY  1990    Prior to Admission medications   Medication Sig Start Date End Date Taking? Authorizing Provider  albuterol (VENTOLIN HFA) 108 (90 Base) MCG/ACT inhaler Inhale 2 puffs into the lungs every 6 (six) hours as needed for wheezing or shortness of breath. 11/06/20   Mar Daring, PA-C  amLODipine (NORVASC) 2.5 MG tablet Take 1 tablet (2.5 mg total) by mouth daily. 06/19/20   Birdie Sons, MD  Cholecalciferol (VITAMIN D3) 2000 UNITS TABS Take 1 tablet by mouth daily. Takes 1 tablet 2 or 3 times per week. 05/10/10   [provider]  Flaxseed, Linseed, (RA FLAX SEED OIL 1000  PO) Take 1 capsule by mouth daily.    [provider]  GRAPE SEED EXTRACT PO Take by mouth.    [provider]  irbesartan-hydrochlorothiazide (AVALIDE) 150-12.5 MG tablet Take 1 tablet by mouth daily. 06/19/20   Birdie Sons, MD  Magnesium Oxide 500 MG (LAX) TABS Take Over The Counter Magnesium Oxide 400 to 600 mg per day (if develop diarrhea - try magnesium glycinate 400  to 600 mg per day) 10/10/17   [provider]  metFORMIN (GLUCOPHAGE-XR) 500 MG 24 hr tablet Take 1 tablet (500 mg total) by mouth daily. 06/23/20   Birdie Sons, MD  Omega-3 Fatty Acids (FISH OIL CONCENTRATE) 1000 MG CAPS Take 1 capsule by mouth daily.  09/22/09   [provider]  omeprazole (PRILOSEC) 40 MG capsule Take 1 capsule (40 mg total) by mouth daily. 03/06/21   Jearld Fenton, NP  topiramate (TOPAMAX) 50 MG tablet Take 1 tablet (50 mg total) by mouth 3 (three) times daily. 01/15/21   Birdie Sons, MD    Allergies Sulfa antibiotics  Family History  Problem Relation Age of Onset  . Hypertension Mother   . Diabetes Brother   . Heart disease Brother   . Heart attack Brother     Social History Social History   Tobacco Use  . Smoking status: Never Smoker  . Smokeless tobacco: Never Used  Vaping Use  . Vaping Use: Never used  Substance Use Topics  . Alcohol use: Yes    Alcohol/week: 0.0 standard drinks    Comment: rare  . Drug use: No    Review of Systems  Constitutional: Negative for fever, chills or body. Cardiovascular: Negative for chest pain or chest tightness. Respiratory: Negative for cough or shortness of breath. Gastrointestinal: Positive for food stuck in esophagus, upper abdominal pain.  Negative for nausea, constipation or diarrhea. Neurological: Negative for headaches or dizziness. ____________________________________________  PHYSICAL EXAM:  VITAL SIGNS: ED Triage Vitals  Enc Vitals Group     BP 03/06/21 1540 (!) 154/77     Pulse Rate 03/06/21 1540 64     Resp 03/06/21 1540 18     Temp 03/06/21 1540 98.5 F (36.9 C)     Temp Source 03/06/21 1540 Oral     SpO2 03/06/21 1540 98 %     Weight 03/06/21 1538 210 lb (95.3 kg)     Height 03/06/21 1538 5\' 10"  (1.778 m)     Head Circumference --      Peak Flow --      Pain Score 03/06/21 1538 0     Pain Loc --      Pain Edu? --      Excl. in [place]? --     Constitutional:  Alert and oriented.  Appears uncomfortable but in no distress. Head: Normocephalic. Eyes:  Normal extraocular movements Cardiovascular: Normal rate, regular rhythm.  Respiratory: Normal respiratory effort. No wheezes/rales/rhonchi noted. Gastrointestinal: Soft, mildly tender in the epigastric region.  Hypoactive bowel sounds.  No distention or masses noted. Neurologic:  Normal speech and language. No gross focal neurologic deficits are appreciated. Psychiatric: Mildly anxious. ____________________________________________   INITIAL IMPRESSION / ASSESSMENT AND PLAN / ED COURSE  Food Impaction in Esophagus, History of Esophageal Stricture:  Glucagon 1 mg IV x 1 ordered but pt feels like the food impaction passed prior to getting this done We had him drink 8 oz of water and 8 oz of Coke- he was able to swallow this without difficulty  He would like discharge without any other intervention at this time RX for Omeprazole 40 mg daily He will follow up with GI as an outpatient __________________________  FINAL CLINICAL IMPRESSION(S) / ED DIAGNOSES  Final diagnoses:  Food impaction of esophagus, initial encounter  Esophageal stricture      Jearld Fenton, NP 03/06/21 1740    Duffy Bruce, MD 03/07/21 1549

## 2021-03-06 NOTE — ED Triage Notes (Addendum)
Pt states he feels like he has a piece of broccoli stuck in his esophagus- airway patent- pt having some vomiting- pt states he tried drinking coke and cannot get it to go down- pt states this has happened multiple times before and he usually has to get it removed

## 2021-03-16 ENCOUNTER — Other Ambulatory Visit: Payer: Self-pay | Admitting: Family Medicine

## 2021-03-16 DIAGNOSIS — I10 Essential (primary) hypertension: Secondary | ICD-10-CM

## 2021-03-16 DIAGNOSIS — G43809 Other migraine, not intractable, without status migrainosus: Secondary | ICD-10-CM

## 2021-03-16 NOTE — Telephone Encounter (Signed)
   Notes to clinic:  PATIENT IS REQU MEDS TO BE SYNC'D TO 90 DAYS SUPPLY- THE PREV RX FOR THIS MED WAS FOR 60 DAYS- PLEASE CONSIDER 90 DAY SUPPLY 0- THX YOU   Requested Prescriptions  Pending Prescriptions Disp Refills   topiramate (TOPAMAX) 50 MG tablet [Pharmacy Med Name: TOPIRAMATE 50 MG TAB] 270 tablet 0      Not Delegated - Neurology: Anticonvulsants - topiramate & zonisamide Failed - 03/16/2021 10:00 AM      Failed - This refill cannot be delegated      Passed - Cr in normal range and within 360 days    Creatinine  Date Value Ref Range Status  10/25/2012 1.41 (H) 0.60 - 1.30 mg/dL Final   Creatinine, Ser  Date Value Ref Range Status  01/19/2021 1.16 0.76 - 1.27 mg/dL Final          Passed - CO2 in normal range and within 360 days    CO2  Date Value Ref Range Status  01/19/2021 23 20 - 29 mmol/L Final   Co2  Date Value Ref Range Status  10/25/2012 24 21 - 32 mmol/L Final          Passed - Valid encounter within last 12 months    Recent Outpatient Visits           2 months ago Welcome to [employer] preventive visit   [facility] Birdie Sons, MD   4 months ago Atypical pneumonia   [facility] Jessie, Clearnce Sorrel, [place]   5 months ago [place], Kirstie Peri, MD   8 months ago Essential (primary) hypertension   [facility] Birdie Sons, MD   12 months ago Annual physical exam   [facility] Caryn Section, Kirstie Peri, MD

## 2021-06-22 ENCOUNTER — Other Ambulatory Visit: Payer: Self-pay | Admitting: Family Medicine

## 2021-06-22 DIAGNOSIS — I10 Essential (primary) hypertension: Secondary | ICD-10-CM

## 2021-06-22 DIAGNOSIS — R7303 Prediabetes: Secondary | ICD-10-CM

## 2021-06-22 DIAGNOSIS — G43809 Other migraine, not intractable, without status migrainosus: Secondary | ICD-10-CM

## 2021-06-22 NOTE — Telephone Encounter (Signed)
Requested medication (s) are due for refill today: no  Requested medication (s) are on the active medication list: yes   Last refill:  12/21/2020  Future visit scheduled: no  Notes to clinic: this refill cannot be delegated    Requested Prescriptions  Pending Prescriptions Disp Refills   topiramate (TOPAMAX) 50 MG tablet [Pharmacy Med Name: TOPIRAMATE 50 MG TAB] 90 tablet     Sig: TAKE (1) TABLET BY MOUTH EVERY DAY     Not Delegated - Neurology: Anticonvulsants - topiramate & zonisamide Failed - 06/22/2021  2:53 PM      Failed - This refill cannot be delegated      Passed - Cr in normal range and within 360 days    Creatinine  Date Value Ref Range Status  10/25/2012 1.41 (H) 0.60 - 1.30 mg/dL Final   Creatinine, Ser  Date Value Ref Range Status  01/19/2021 1.16 0.76 - 1.27 mg/dL Final          Passed - CO2 in normal range and within 360 days    CO2  Date Value Ref Range Status  01/19/2021 23 20 - 29 mmol/L Final   Co2  Date Value Ref Range Status  10/25/2012 24 21 - 32 mmol/L Final          Passed - Valid encounter within last 12 months    Recent Outpatient Visits           5 months ago Welcome to [employer] preventive visit   [facility] Birdie Sons, MD   7 months ago Atypical pneumonia   [facility], [place], [place]   8 months ago [facility] Caryn Section, Kirstie Peri, MD   12 months ago Essential (primary) hypertension   [facility], Kirstie Peri, MD   1 year ago Annual physical exam   [facility] Birdie Sons, MD              Signed Prescriptions Disp Refills   irbesartan-hydrochlorothiazide (AVALIDE) 150-12.5 MG tablet 90 tablet 0    Sig: TAKE ONE (1) TABLET BY MOUTH ONCE DAILY     Cardiovascular: ARB + Diuretic Combos Passed - 06/22/2021  2:53 PM      Passed - K in normal range and within 180 days    Potassium  Date Value Ref Range Status   01/19/2021 4.0 3.5 - 5.2 mmol/L Final  10/25/2012 3.3 (L) 3.5 - 5.1 mmol/L Final          Passed - Na in normal range and within 180 days    Sodium  Date Value Ref Range Status  01/19/2021 142 134 - 144 mmol/L Final  10/25/2012 142 136 - 145 mmol/L Final          Passed - Cr in normal range and within 180 days    Creatinine  Date Value Ref Range Status  10/25/2012 1.41 (H) 0.60 - 1.30 mg/dL Final   Creatinine, Ser  Date Value Ref Range Status  01/19/2021 1.16 0.76 - 1.27 mg/dL Final          Passed - Ca in normal range and within 180 days    Calcium  Date Value Ref Range Status  01/19/2021 9.6 8.6 - 10.2 mg/dL Final   Calcium, Total  Date Value Ref Range Status  10/25/2012 9.0 8.5 - 10.1 mg/dL Final          Passed - Patient is not pregnant  Passed - Last BP in normal range    BP Readings from Last 1 Encounters:  03/06/21 138/81          Passed - Valid encounter within last 6 months    Recent Outpatient Visits           5 months ago Welcome to [employer] preventive visit   [facility] Birdie Sons, MD   7 months ago Atypical pneumonia   [facility], Clearnce Sorrel, [place]   8 months ago [place], Kirstie Peri, MD   12 months ago Essential (primary) hypertension   [facility] Birdie Sons, MD   1 year ago Annual physical exam   [facility] Birdie Sons, MD               metFORMIN (GLUCOPHAGE-XR) 500 MG 24 hr tablet 90 tablet 2    Sig: TAKE ONE (1) TABLET BY MOUTH ONCE DAILY     Endocrinology:  Diabetes - Biguanides Passed - 06/22/2021  2:53 PM      Passed - Cr in normal range and within 360 days    Creatinine  Date Value Ref Range Status  10/25/2012 1.41 (H) 0.60 - 1.30 mg/dL Final   Creatinine, Ser  Date Value Ref Range Status  01/19/2021 1.16 0.76 - 1.27 mg/dL Final          Passed - HBA1C is between 0 and 7.9 and within 180  days    Hgb A1c MFr Bld  Date Value Ref Range Status  01/19/2021 5.4 4.8 - 5.6 % Final    Comment:             Prediabetes: 5.7 - 6.4          Diabetes: >6.4          Glycemic control for adults with diabetes: <7.0           Passed - eGFR in normal range and within 360 days    EGFR (African American)  Date Value Ref Range Status  10/25/2012 >60  Final   GFR calc Af Amer  Date Value Ref Range Status  02/21/2020 86 >59 mL/min/1.73 Final    Comment:    **[facility] currently reports eGFR in compliance with the current**   recommendations of the [employer]. [facility] will   update reporting as new guidelines are published from the NKF-ASN   Task force.    EGFR (Non-African Amer.)  Date Value Ref Range Status  10/25/2012 55 (L)  Final    Comment:    eGFR values <29m/min/1.73 m2 may be an indication of chronic kidney disease (CKD). Calculated eGFR is useful in patients with stable renal function. The eGFR calculation will not be reliable in acutely ill patients when serum creatinine is changing rapidly. It is not useful in  patients on dialysis. The eGFR calculation may not be applicable to patients at the low and high extremes of body sizes, pregnant women, and vegetarians.    GFR calc non Af Amer  Date Value Ref Range Status  02/21/2020 75 >59 mL/min/1.73 Final   eGFR  Date Value Ref Range Status  01/19/2021 70 >59 mL/min/1.73 Final          Passed - Valid encounter within last 6 months    Recent Outpatient Visits           5 months ago [facility] [employer]   [facility] Bird[facility] Sons, MD   7 months ago Atypical pneumonia   [facility] Mar Daring, [place]   8 months ago [facility] Caryn Section, Kirstie Peri, MD   12 months ago Essential (primary) hypertension   [facility] Birdie Sons, MD   1 year ago Annual physical exam   [facility]  Birdie Sons, MD

## 2021-07-14 ENCOUNTER — Other Ambulatory Visit: Payer: Self-pay | Admitting: Family Medicine

## 2021-07-14 ENCOUNTER — Telehealth: Payer: Self-pay

## 2021-07-14 NOTE — Telephone Encounter (Signed)
Patient having intermittent episodes of heart flutter with SOB. Began one month ago. Occurred 5 days ago and again today mostly during the day at any given time and no particular activity-at rest sometimes. Lasts about 20-30 minutes then resolves. No b/p or pulse readings. Because he was feeling this way at the time of our call I advised him to go to the ED. He stated he would go if it occurs again but he is not going now as the episode is resolving. Encouraged the patient to go on to ED for an evaluation at this time. I do not believe he will go today.

## 2021-07-14 NOTE — Telephone Encounter (Signed)
  -----   Message -----      From:Demarrio Ronnie Derby      Sent:07/13/2021  7:25 PM EDT        [id number] Family Practice   Subject:Appointment scheduled from [place] For: Arthur Davis ([id number]) Visit Type: OFFICE VISIT (1004)  07/26/2021   10:20 AM  20 mins.  Birdie Sons, MD      [facility]  Patient Comments: Sometimes waking during the night with Heart fluttering (also happens during the day) feeling funny when this occurs. Chest is heavy and a little short of breath. My heart rate seams to be normal when this occurs.

## 2021-07-14 NOTE — Telephone Encounter (Signed)
Patient scheduled an appointment via mychart to see Dr. Caryn Section on 07/26/2021 for symptoms below. I tried calling patient to get more information about symptoms. Left message to call back. I also sent patient a mychart message stating that he needs to call the office to speak with a triage nurse. Patients symptoms need to be triaged. OK for [facility] triage to speak with patient about symptoms.

## 2021-07-15 NOTE — Telephone Encounter (Signed)
FYI

## 2021-07-20 ENCOUNTER — Emergency Department: Payer: Medicare Other

## 2021-07-20 ENCOUNTER — Other Ambulatory Visit: Payer: Self-pay

## 2021-07-20 ENCOUNTER — Emergency Department
Admission: EM | Admit: 2021-07-20 | Discharge: 2021-07-20 | Disposition: A | Discharge status: Home / Self Care | Payer: Medicare Other | Attending: Emergency Medicine | Admitting: Emergency Medicine

## 2021-07-20 DIAGNOSIS — I10 Essential (primary) hypertension: Secondary | ICD-10-CM | POA: Diagnosis not present

## 2021-07-20 DIAGNOSIS — Z7984 Long term (current) use of oral hypoglycemic drugs: Secondary | ICD-10-CM | POA: Insufficient documentation

## 2021-07-20 DIAGNOSIS — R002 Palpitations: Secondary | ICD-10-CM | POA: Insufficient documentation

## 2021-07-20 DIAGNOSIS — E119 Type 2 diabetes mellitus without complications: Secondary | ICD-10-CM | POA: Diagnosis not present

## 2021-07-20 DIAGNOSIS — Z79899 Other long term (current) drug therapy: Secondary | ICD-10-CM | POA: Insufficient documentation

## 2021-07-20 LAB — CBC
Hemoglobin: 15.3 g/dL (ref 13.0–17.0)
Platelets: 157 10*3/uL (ref 150–400)
WBC: 8.3 10*3/uL (ref 4.0–10.5)

## 2021-07-20 LAB — HEPATIC FUNCTION PANEL
ALT: 25 U/L (ref 0–44)
AST: 21 U/L (ref 15–41)
Albumin: 4.4 g/dL (ref 3.5–5.0)
Alkaline Phosphatase: 52 U/L (ref 38–126)
Bilirubin, Direct: 0.2 mg/dL (ref 0.0–0.2)
Indirect Bilirubin: 1.2 mg/dL — ABNORMAL HIGH (ref 0.3–0.9)
Total Bilirubin: 1.4 mg/dL — ABNORMAL HIGH (ref 0.3–1.2)
Total Protein: 6.5 g/dL (ref 6.5–8.1)

## 2021-07-20 LAB — BASIC METABOLIC PANEL
Anion gap: 7 (ref 5–15)
BUN: 12 mg/dL (ref 8–23)
CO2: 26 mmol/L (ref 22–32)
Calcium: 9.2 mg/dL (ref 8.9–10.3)
Chloride: 106 mmol/L (ref 98–111)
Creatinine, Ser: 1.05 mg/dL (ref 0.61–1.24)
GFR, Estimated: >60 mL/min (ref >60)
Glucose, Bld: 138 mg/dL — ABNORMAL HIGH (ref 70–99)
Potassium: 3.4 mmol/L — ABNORMAL LOW (ref 3.5–5.1)
Sodium: 139 mmol/L (ref 135–145)

## 2021-07-20 LAB — TROPONIN I (HIGH SENSITIVITY)
Troponin I (High Sensitivity): 3 ng/L (ref <18)
Troponin I (High Sensitivity): 4 ng/L (ref <18)

## 2021-07-20 LAB — TSH: TSH: 1.002 u[IU]/mL (ref 0.350–4.500)

## 2021-07-20 NOTE — ED Provider Notes (Signed)
[facility] Emergency Department Provider Note  ____________________________________________  Time seen: Approximately 8:48 PM  I have reviewed the triage vital signs and the nursing notes.   HISTORY  Chief Complaint Chest Pain    HPI Arthur Davis is a 65 y.o. male with a history of hypertension, diabetes who comes ED complaining of intermittent palpitations since been going on for 3 months.  Most recently occurred today at about 1:00 and lasted for an hour.  He does regular exercise including today when he did 45 minutes of weights and stairmaster.  He does not get any chest pain shortness of breath pressure dizziness or other acute symptoms while exercising.  He has been symptom-free for the last 6 hours or so.  While he was having the palpitations he checked his heart rate and it was normal, approximately 80.  He did not have any other acute symptoms with it such as dizziness or syncope.  Has an appointment to follow-up with his PCP in 6 days.  No history of weight changes, hot or cold intolerance or severe "spells."    Past Medical History:  Diagnosis Date   History of colonic polyps 05/09/2006   Hypertension 03/20/2000   Pre-diabetes      Patient Active Problem List   Diagnosis Date Noted   Prediabetes 02/19/2020   Foreign body in esophagus    Osteoarthritis of left knee 10/30/2017   GERD (gastroesophageal reflux disease) 10/13/2015   Gallbladder sludge 10/13/2015   Right-sided back pain 09/28/2015   Obesity (BMI 30-39.9) 09/10/2015   OSA on CPAP 09/10/2015   BPH (benign prostatic hyperplasia) 07/13/2015   Dysphagia 07/13/2015   H/O adenomatous polyp of colon 07/13/2015   Leg pain 07/13/2015   Bad memory 07/13/2015   Disturbance of skin sensation 07/13/2015   Seborrheic keratosis 07/13/2015   Vitamin D deficiency 07/13/2015   ADD (attention deficit hyperactivity disorder, inattentive type) 09/02/2009   Benign paroxysmal vertigo  07/27/2009   Hyperglyceridemia, pure 03/23/2006   Headache, migraine 03/22/2006   Essential (primary) hypertension 03/20/2000     Past Surgical History:  Procedure Laterality Date   APPENDECTOMY  1960   BRAIN SURGERY  1960's   Removed pressure from brain caused by head injury   ESOPHAGOGASTRODUODENOSCOPY N/A 10/23/2019   Procedure: ESOPHAGOGASTRODUODENOSCOPY (EGD);  Surgeon: Lucilla Lame, MD;  Location: [facility] ENDOSCOPY;  Service: Endoscopy;  Laterality: N/A;   nasal abscess  1960   no information provider   TONSILLECTOMY AND ADENOIDECTOMY  1960   UPPER GI ENDOSCOPY  02/20/12   [facility], Normal Esophagus, Nornal Stomach, Normal duodenum, Dr. Candace Cruise; Dilated for dysphagia   UPPER GI ENDOSCOPY  09/16/2013   Dr. Tiffany Kocher; Changes suspicious for eosinophilic esophaigitis. Normal stomach and duodenum   VASECTOMY  1990     Prior to Admission medications   Medication Sig Start Date End Date Taking? Authorizing Provider  albuterol (VENTOLIN HFA) 108 (90 Base) MCG/ACT inhaler Inhale 2 puffs into the lungs every 6 (six) hours as needed for wheezing or shortness of breath. 11/06/20   Mar Daring, PA-C  amLODipine (NORVASC) 2.5 MG tablet TAKE ONE (1) TABLET BY MOUTH ONCE DAILY 03/16/21   Birdie Sons, MD  Cholecalciferol (VITAMIN D3) 2000 UNITS TABS Take 1 tablet by mouth daily. Takes 1 tablet 2 or 3 times per week. 05/10/10   [provider]  Flaxseed, Linseed, (RA FLAX SEED OIL 1000 PO) Take 1 capsule by mouth daily.    [provider]  GRAPE SEED EXTRACT  PO Take by mouth.    [provider]  irbesartan-hydrochlorothiazide (AVALIDE) 150-12.5 MG tablet TAKE ONE (1) TABLET BY MOUTH ONCE DAILY 06/22/21   Birdie Sons, MD  Magnesium Oxide 500 MG (LAX) TABS Take Over The Counter Magnesium Oxide 400 to 600 mg per day (if develop diarrhea - try magnesium glycinate 400 to 600 mg per day) 10/10/17   [provider]  metFORMIN (GLUCOPHAGE-XR) 500 MG 24 hr tablet TAKE  ONE (1) TABLET BY MOUTH ONCE DAILY 06/22/21   Birdie Sons, MD  Omega-3 Fatty Acids (FISH OIL CONCENTRATE) 1000 MG CAPS Take 1 capsule by mouth daily.  09/22/09   [provider]  omeprazole (PRILOSEC) 40 MG capsule Take 1 capsule (40 mg total) by mouth daily. 03/06/21   Jearld Fenton, NP  topiramate (TOPAMAX) 50 MG tablet TAKE (1) TABLET BY MOUTH EVERY DAY 06/22/21   Birdie Sons, MD     Allergies Sulfa antibiotics   Family History  Problem Relation Age of Onset   Hypertension Mother    Diabetes Brother    Heart disease Brother    Heart attack Brother     Social History Social History   Tobacco Use   Smoking status: Never   Smokeless tobacco: Never  Vaping Use   Vaping Use: Never used  Substance Use Topics   Alcohol use: Yes    Alcohol/week: 0.0 standard drinks    Comment: rare   Drug use: No    Review of Systems  Constitutional:   No fever or chills.  ENT:   No sore throat. No rhinorrhea. Cardiovascular:   No chest pain or syncope.  Positive palpitations Respiratory:   No dyspnea or cough. Gastrointestinal:   Negative for abdominal pain, vomiting and diarrhea.  Musculoskeletal:   Negative for focal pain or swelling All other systems reviewed and are negative except as documented above in ROS and HPI.  ____________________________________________   PHYSICAL EXAM:  VITAL SIGNS: ED Triage Vitals [07/20/21 1505]  Enc Vitals Group     BP 135/73     Pulse Rate 67     Resp 18     Temp 98 F (36.7 C)     Temp src      SpO2 100 %     Weight      Height      Head Circumference      Peak Flow      Pain Score 1     Pain Loc      Pain Edu?      Excl. in [place]?     Vital signs reviewed, nursing assessments reviewed.   Constitutional:   Alert and oriented. Non-toxic appearance. Eyes:   Conjunctivae are normal. EOMI. PERRL. ENT      Head:   Normocephalic and atraumatic.      Nose:   Wearing a mask.      Mouth/Throat:   Wearing a mask.       Neck:   No meningismus. Full ROM. Hematological/Lymphatic/Immunilogical:   No cervical lymphadenopathy. Cardiovascular:   RRR. Symmetric bilateral radial and DP pulses.  No murmurs. Cap refill less than 2 seconds.  No pulse delay Respiratory:   Normal respiratory effort without tachypnea/retractions. Breath sounds are clear and equal bilaterally. No wheezes/rales/rhonchi. Gastrointestinal:   Soft and nontender. Non distended. There is no CVA tenderness.  No rebound, rigidity, or guarding. Genitourinary:   deferred Musculoskeletal:   Normal range of motion in all extremities. No joint effusions.  No lower extremity tenderness.  No edema. Neurologic:   Normal speech and language.  Motor grossly intact. No acute focal neurologic deficits are appreciated.  Skin:    Skin is warm, dry and intact. No rash noted.  No petechiae, purpura, or bullae.  ____________________________________________    LABS (pertinent positives/negatives) (all labs ordered are listed, but only abnormal results are displayed) Labs Reviewed  BASIC METABOLIC PANEL - Abnormal; Notable for the following components:      Result Value   Potassium 3.4 (*)    Glucose, Bld 138 (*)    All other components within normal limits  HEPATIC FUNCTION PANEL - Abnormal; Notable for the following components:   Total Bilirubin 1.4 (*)    Indirect Bilirubin 1.2 (*)    All other components within normal limits  CBC  TSH  TROPONIN I (HIGH SENSITIVITY)  TROPONIN I (HIGH SENSITIVITY)   ____________________________________________   EKG  Interpreted by me  Date: 07/20/2021  Rate: 64  Rhythm: normal sinus rhythm  QRS Axis: normal  Intervals: normal  ST/T Wave abnormalities: normal  Conduction Disutrbances: none  Narrative Interpretation: unremarkable     ____________________________________________    RADIOLOGY  DG Chest 2 View  Result Date: 07/20/2021 CLINICAL DATA:  Chest discomfort and shortness of breath. EXAM:  CHEST - 2 VIEW COMPARISON:  July 21, 2008 FINDINGS: The heart size and mediastinal contours are within normal limits. Both lungs are clear. A chronic fracture deformity is seen involving the mid left clavicle. IMPRESSION: No active cardiopulmonary disease. Electronically Signed   By: Virgina Norfolk M.D.   On: 07/20/2021 15:55    ____________________________________________   PROCEDURES Procedures  ____________________________________________    CLINICAL IMPRESSION / ASSESSMENT AND PLAN / ED COURSE  Medications ordered in the ED: Medications - No data to display  Pertinent labs & imaging results that were available during my care of the patient were reviewed by me and considered in my medical decision making (see chart for details).  Arthur Davis was evaluated in Emergency Department on 07/20/2021 for the symptoms described in the history of present illness. He was evaluated in the context of the global COVID-19 pandemic, which necessitated consideration that the patient might be at risk for infection with the SARS-CoV-2 virus that causes COVID-19. Institutional protocols and algorithms that pertain to the evaluation of patients at risk for COVID-19 are in a state of rapid change based on information released by regulatory bodies including the CDC and federal and state organizations. These policies and algorithms were followed during the patient's care in the ED.   Patient presents with intermittent palpitations.  Vital signs are normal, he is currently asymptomatic.  History is overall reassuring especially his tolerance for strenuous exercise without symptoms.   Considering the patient's symptoms, medical history, and physical examination today, I have low suspicion for ACS, PE, TAD, pneumothorax, carditis, mediastinitis, pneumonia, CHF, or sepsis.  His examination today, vital signs, EKG, labs are all normal.  Doubt ACS, hyperthyroidism, pheochromocytoma, anxiety.  Recommend he  follow-up with cardiology for evaluation for Holter monitor.      ____________________________________________   FINAL CLINICAL IMPRESSION(S) / ED DIAGNOSES    Final diagnoses:  Palpitations     ED Discharge Orders     None       Portions of this note were generated with dragon dictation software. Dictation errors may occur despite best attempts at proofreading.    Carrie Mew, MD 07/20/21 2052

## 2021-07-20 NOTE — ED Triage Notes (Signed)
Pt come with c/o some chest discomfort. Pt states earlier he had an episode of tachycardia nad felt his HR was beating faster than normal.  Pt states this has been on and off for about 3 months. Pt states he was told to come to ED next  time it happened.  Pt states some SOB earlier when it first started.

## 2021-07-20 NOTE — ED Notes (Signed)
Pt NAD, a/ox4. Pt verbalizes understanding of all DC and f/u instructions. All questions answered. Pt walks with steady gait to lobby at DC.  ? ?

## 2021-07-20 NOTE — ED Provider Notes (Signed)
Emergency Medicine Provider Triage Evaluation Note  DELFINO FRIESEN , a 65 y.o. male  was evaluated in triage.  Pt complains of heart racing.  Some chest discomfort..  Review of Systems  Positive: Chest discomfort and heart racing Negative: Denies fever chills, shortness of breath  Physical Exam  BP 135/73   Pulse 67   Temp 98 F (36.7 C)   Resp 18   SpO2 100%  Gen:   Awake, no distress   Resp:  Normal effort  MSK:   Moves extremities without difficulty  Other:  Heart sounds are normal, regular rate and rhythm  Medical Decision Making  Medically screening exam initiated at 3:20 PM.  Appropriate orders placed.  SOHAIL CAPRARO was informed that the remainder of the evaluation will be completed by another provider, this initial triage assessment does not replace that evaluation, and the importance of remaining in the ED until their evaluation is complete.     Versie Starks, PA-C 07/20/21 1521    Carrie Mew, MD 07/20/21 [phone]

## 2021-07-26 ENCOUNTER — Ambulatory Visit (INDEPENDENT_AMBULATORY_CARE_PROVIDER_SITE_OTHER): Payer: Medicare Other | Admitting: Family Medicine

## 2021-07-26 ENCOUNTER — Encounter: Payer: Self-pay | Admitting: Family Medicine

## 2021-07-26 ENCOUNTER — Other Ambulatory Visit: Payer: Self-pay

## 2021-07-26 VITALS — BP 123/71 | HR 62 | Wt 210.0 lb

## 2021-07-26 DIAGNOSIS — R002 Palpitations: Secondary | ICD-10-CM

## 2021-07-26 NOTE — Progress Notes (Signed)
Established patient visit   Patient: Arthur Davis   DOB: 24-Jan-1956   65 y.o. Male  MRN: [id number] Visit Date: 07/26/2021  Today's healthcare provider: Lelon Huh, MD   Chief Complaint  Patient presents with   Palpitations   Subjective    Palpitations  This is a new problem. Episode onset: Started about three months ago. Episode frequency: Happens about three times a week. The problem has been waxing and waning. Exacerbated by: After exercising. Associated symptoms include chest pain, an irregular heartbeat and shortness of breath. Pertinent negatives include no coughing, dizziness, malaise/fatigue, near-syncope, numbness, syncope or weakness.     Pt was in the ER  07/20/2021 and had normal CBC, Met C and TSH. He has appt scheduled with Dr. Clayborn Bigness on 08-05-2021.    Medications: Outpatient Medications Prior to Visit  Medication Sig   amLODipine (NORVASC) 2.5 MG tablet TAKE ONE (1) TABLET BY MOUTH ONCE DAILY   Cholecalciferol (VITAMIN D3) 2000 UNITS TABS Take 1 tablet by mouth daily. Takes 1 tablet 2 or 3 times per week.   Flaxseed, Linseed, (RA FLAX SEED OIL 1000 PO) Take 1 capsule by mouth daily.   GRAPE SEED EXTRACT PO Take by mouth.   irbesartan-hydrochlorothiazide (AVALIDE) 150-12.5 MG tablet TAKE ONE (1) TABLET BY MOUTH ONCE DAILY   Magnesium Oxide 500 MG (LAX) TABS Take Over The Counter Magnesium Oxide 400 to 600 mg per day (if develop diarrhea - try magnesium glycinate 400 to 600 mg per day)   metFORMIN (GLUCOPHAGE-XR) 500 MG 24 hr tablet TAKE ONE (1) TABLET BY MOUTH ONCE DAILY   Omega-3 Fatty Acids (FISH OIL CONCENTRATE) 1000 MG CAPS Take 1 capsule by mouth daily.    omeprazole (PRILOSEC) 40 MG capsule Take 1 capsule (40 mg total) by mouth daily.   topiramate (TOPAMAX) 50 MG tablet TAKE (1) TABLET BY MOUTH EVERY DAY   albuterol (VENTOLIN HFA) 108 (90 Base) MCG/ACT inhaler Inhale 2 puffs into the lungs every 6 (six) hours as needed for wheezing or shortness of  breath.   No facility-administered medications prior to visit.    Review of Systems  Constitutional: Negative.  Negative for malaise/fatigue.  Respiratory:  Positive for shortness of breath. Negative for apnea, cough, choking, chest tightness, wheezing and stridor.   Cardiovascular:  Positive for chest pain and palpitations. Negative for leg swelling, syncope and near-syncope.  Neurological:  Positive for speech difficulty (Recently.). Negative for dizziness, syncope, weakness, light-headedness, numbness and headaches.      Objective    BP 123/71 (BP Location: Left Arm, Patient Position: Sitting, Cuff Size: Large)   Pulse 62   Wt 210 lb (95.3 kg)   SpO2 97%   BMI 30.13 kg/m    Physical Exam   General: Appearance:    Mildly obese male in no acute distress  Eyes:    PERRL, conjunctiva/corneas clear, EOM's intact       Lungs:     Clear to auscultation bilaterally, respirations unlabored  Heart:    Normal heart rate. Normal rhythm. No murmurs, rubs, or gallops.    MS:   All extremities are intact.    Neurologic:   Awake, alert, oriented x 3. No apparent focal neurological defect.         No results found for any visits on 07/26/21.  Assessment & Plan     1. Heart palpitations Normal ER evaluation and labs. He is having sx every 2-3 days. Will get him on a 32  hour Zio monitor. He has follow up scheduled with cardiology on 08-05-2021 - EKG 12-Lead        The entirety of the information documented in the History of Present Illness, Review of Systems and Physical Exam were personally obtained by me. Portions of this information were initially documented by the CMA and reviewed by me for thoroughness and accuracy.     Lelon Huh, MD  [facility] [phone] (phone) [phone] (fax)  [place]

## 2021-08-11 ENCOUNTER — Telehealth: Payer: Self-pay | Admitting: Family Medicine

## 2021-08-11 NOTE — Telephone Encounter (Signed)
Faxed to Dr. Clayborn Bigness

## 2021-10-13 ENCOUNTER — Other Ambulatory Visit: Payer: Self-pay | Admitting: Internal Medicine

## 2021-10-13 MED ORDER — OMEPRAZOLE 40 MG PO CPDR: 40.0000 mg | DELAYED_RELEASE_CAPSULE | Freq: Every day | ORAL | 1 refills | Status: DC

## 2021-10-13 NOTE — Telephone Encounter (Signed)
Provider already refilled medication  Requested Prescriptions  Refused Prescriptions Disp Refills   omeprazole (PRILOSEC) 40 MG capsule [Pharmacy Med Name: OMEPRAZOLE DR 40 MG CAP] 90 capsule 0    Sig: TAKE (1) [place]     Gastroenterology: Proton Pump Inhibitors Passed - 10/13/2021  3:26 PM      Passed - Valid encounter within last 12 months    Recent Outpatient Visits          2 months ago Heart palpitations   [facility] Birdie Sons, MD   9 months ago [facility] to [employer] preventive visit   [facility] Birdie Sons, MD   11 months ago Atypical pneumonia   [place], Clearnce Sorrel, [place]   1 year ago [facility] Caryn Section, Kirstie Peri, MD   1 year ago Essential (primary) hypertension   [facility] Caryn Section, Kirstie Peri, MD

## 2021-11-16 ENCOUNTER — Ambulatory Visit: Payer: Self-pay

## 2021-11-16 NOTE — Telephone Encounter (Signed)
° ° °  Chief Complaint: Lower abdominal pain Symptoms: Lower abdominal pain that comes and goes, but is getting more frequent.Sometimes wakes him up at night. Frequency: Started 6 months ago. Pertinent Negatives: Patient denies diarrhea or constipation. Disposition: [] ED /[] Urgent Care (no appt availability in office) / [] Appointment(In office/virtual)/ []  [facility]/ [] Home Care/ [] Refused Recommended Disposition /[] [facility]/ []  Follow-up with PCP Additional Notes: Offered acute appointment, but prefers to see Dr. Caryn Section for a physical as well and discuss colonoscopy (states he is due screening colonoscopy). Requests to be seen as soon as Dr. Caryn Section could work him in. Please advise pt.   Answer Assessment - Initial Assessment Questions 1. LOCATION: "Where does it hurt?"      Lower - below belly button 2. RADIATION: "Does the pain shoot anywhere else?" (e.g., chest, back)     No 3. ONSET: "When did the pain begin?" (Minutes, hours or days ago)      6 months ago 4. SUDDEN: "Gradual or sudden onset?"     Gradual 5. PATTERN "Does the pain come and go, or is it constant?"    - If constant: "Is it getting better, staying the same, or worsening?"      (Note: Constant means the pain never goes away completely; most serious pain is constant and it progresses)     - If intermittent: "How long does it last?" "Do you have pain now?"     (Note: Intermittent means the pain goes away completely between bouts)     Comes and goes- worse at night 6. SEVERITY: "How bad is the pain?"  (e.g., Scale 1-10; mild, moderate, or severe)    - MILD (1-3): doesn't interfere with normal activities, abdomen soft and not tender to touch     - MODERATE (4-7): interferes with normal activities or awakens from sleep, abdomen tender to touch     - SEVERE (8-10): excruciating pain, doubled over, unable to do any normal activities       2-3 7. RECURRENT SYMPTOM: "Have you ever had this type of  stomach pain before?" If Yes, ask: "When was the last time?" and "What happened that time?"      Yes 8. CAUSE: "What do you think is causing the stomach pain?"     Unsure 9. RELIEVING/AGGRAVATING FACTORS: "What makes it better or worse?" (e.g., movement, antacids, bowel movement)     No 10. OTHER SYMPTOMS: "Do you have any other symptoms?" (e.g., back pain, diarrhea, fever, urination pain, vomiting)       No  Protocols used: Abdominal Pain - Male-A-AH

## 2021-11-16 NOTE — Telephone Encounter (Signed)
Patient advised. Appointment for acute visit scheduled for 11/23/2021 at 9:40am.

## 2021-11-16 NOTE — Telephone Encounter (Signed)
No available for CPE until May. He is due for colonoscopy and can refer to Dr. Alice Reichert for that due to history of colon polyps. If he needs to be seen for evaluation of abdominal pain he needs to make appt for acute problem.

## 2021-11-23 ENCOUNTER — Other Ambulatory Visit: Payer: Self-pay

## 2021-11-23 ENCOUNTER — Ambulatory Visit (INDEPENDENT_AMBULATORY_CARE_PROVIDER_SITE_OTHER): Payer: Medicare Other | Admitting: Family Medicine

## 2021-11-23 ENCOUNTER — Encounter: Payer: Self-pay | Admitting: Family Medicine

## 2021-11-23 VITALS — BP 123/66 | HR 58 | Ht 70.0 in | Wt 211.1 lb

## 2021-11-23 DIAGNOSIS — Z23 Encounter for immunization: Secondary | ICD-10-CM

## 2021-11-23 DIAGNOSIS — Z289 Immunization not carried out for unspecified reason: Secondary | ICD-10-CM

## 2021-11-23 DIAGNOSIS — E781 Pure hyperglyceridemia: Secondary | ICD-10-CM

## 2021-11-23 DIAGNOSIS — R972 Elevated prostate specific antigen [PSA]: Secondary | ICD-10-CM | POA: Diagnosis not present

## 2021-11-23 DIAGNOSIS — R7303 Prediabetes: Secondary | ICD-10-CM

## 2021-11-23 DIAGNOSIS — R1010 Upper abdominal pain, unspecified: Secondary | ICD-10-CM

## 2021-11-23 DIAGNOSIS — E559 Vitamin D deficiency, unspecified: Secondary | ICD-10-CM

## 2021-11-23 DIAGNOSIS — Z1211 Encounter for screening for malignant neoplasm of colon: Secondary | ICD-10-CM

## 2021-11-23 DIAGNOSIS — F5101 Primary insomnia: Secondary | ICD-10-CM

## 2021-11-23 DIAGNOSIS — Z86010 Personal history of colonic polyps: Secondary | ICD-10-CM

## 2021-11-23 DIAGNOSIS — Z860101 Personal history of adenomatous and serrated colon polyps: Secondary | ICD-10-CM

## 2021-11-23 DIAGNOSIS — I10 Essential (primary) hypertension: Secondary | ICD-10-CM

## 2021-11-23 MED ORDER — SHINGRIX 50 MCG/0.5ML IM SUSR: 0.5000 mL | Freq: Once | INTRAMUSCULAR | 0 refills | Status: AC

## 2021-11-23 MED ORDER — ZOLPIDEM TARTRATE 10 MG PO TABS: 10.0000 mg | ORAL_TABLET | Freq: Every day | ORAL | 1 refills | Status: DC

## 2021-11-23 NOTE — Progress Notes (Signed)
I,[name],acting as a Education administrator for Arthur Huh, MD.,have documented all relevant documentation on the behalf of Arthur Huh, MD,as directed by  Arthur Huh, MD while in the presence of Arthur Huh, MD.  Established patient visit   Patient: Arthur Davis   DOB: 1956/02/23   66 y.o. Male  MRN: [id number] Visit Date: 11/23/2021  Today's healthcare provider: Lelon Huh, MD   Chief Complaint  Patient presents with   Abdominal Pain   Subjective    HPI  Abdominal Pain Patient complains of abdominal pain. The pain is described as indigestion, and is 2/10 in intensity. The patient is experiencing stomach pain without radiation. Onset was 6 month ago. Symptoms have been gradually worsening. Aggravating factors: none.  Alleviating factors: Water and Tums. Associated symptoms: none. The patient denies anorexia, arthralagias, belching, chills, constipation, diarrhea, dysuria, fever, flatus, frequency, headache, hematochezia, hematuria, melena, myalgias, nausea, sweats, and vomiting. -Patient states a little after Thanksgiving he stopped eating after 6 pm thinking that may be the issue but the pain is still persistent.  He had old prescription for omeprazole which he started up again but hasn't really helped. -Patient reports not being able to sleep some nights and would like more ambien -Would like shingles vaccine prescribed to receive at pharmacy -would like 2nd pneumococcal vaccine Medications: Outpatient Medications Prior to Visit  Medication Sig   albuterol (VENTOLIN HFA) 108 (90 Base) MCG/ACT inhaler Inhale 2 puffs into the lungs every 6 (six) hours as needed for wheezing or shortness of breath.   amLODipine (NORVASC) 2.5 MG tablet TAKE ONE (1) TABLET BY MOUTH ONCE DAILY   Cholecalciferol (VITAMIN D3) 2000 UNITS TABS Take 1 tablet by mouth daily. Takes 1 tablet 2 or 3 times per week.   Flaxseed, Linseed, (RA FLAX SEED OIL 1000 PO) Take 1 capsule by mouth daily.   GRAPE  SEED EXTRACT PO Take by mouth.   irbesartan-hydrochlorothiazide (AVALIDE) 150-12.5 MG tablet TAKE ONE (1) TABLET BY MOUTH ONCE DAILY   Magnesium Oxide 500 MG (LAX) TABS Take Over The Counter Magnesium Oxide 400 to 600 mg per day (if develop diarrhea - try magnesium glycinate 400 to 600 mg per day)   metFORMIN (GLUCOPHAGE-XR) 500 MG 24 hr tablet TAKE ONE (1) TABLET BY MOUTH ONCE DAILY   Omega-3 Fatty Acids (FISH OIL CONCENTRATE) 1000 MG CAPS Take 1 capsule by mouth daily.    omeprazole (PRILOSEC) 40 MG capsule Take 1 capsule (40 mg total) by mouth daily.   topiramate (TOPAMAX) 50 MG tablet TAKE (1) TABLET BY MOUTH EVERY DAY   No facility-administered medications prior to visit.         Objective    BP 123/66 (BP Location: Right Arm, Patient Position: Sitting, Cuff Size: Normal)    Pulse (!) 58    Ht 5\' 10"  (1.778 m)    Wt 211 lb 1.6 oz (95.8 kg)    SpO2 98%    BMI 30.29 kg/m  {Show previous vital signs (optional):23777}  Physical Exam   General Appearance:    Well developed, well nourished male, alert, cooperative, in no acute distress  Eyes:    PERRL, conjunctiva/corneas clear, EOM's intact       Lungs:     Clear to auscultation bilaterally, respirations unlabored  Heart:    Bradycardic. Normal rhythm. No murmurs, rubs, or gallops.    Abdomen:   Normoactive bowel sounds x 4, round, nondistended, or mild diffuse upper abdominal tenderness. No CVA tenderness  Assessment & Plan     1. Pain of upper abdomen Extensive Differential diagnosis. He does have history of ulcers which sounds like required h. Pylori treatment for many years ago.   - Amylase - Lipase - H. pylori breath test  Consider abdominal ultrasound if labs are not diagnostic.   2. Abnormal PSA  - PSA Total (Reflex To Free)  3. Vitamin D deficiency  - VITAMIN D 25 Hydroxy (Vit-D Deficiency, Fractures)  4. Hyperglyceridemia, pure Currently diet controlled.  - CBC - Comprehensive metabolic panel -  Lipid panel - Hemoglobin A1c  5. Primary insomnia He reports occasional Ambien works well which he takes rarely, but is now out of.   6. Essential (primary) hypertension Well controlled.  Continue current medications.    7. Prediabetes  - Hemoglobin A1c  8. H/O adenomatous polyp of colon Is due for follow up colonoscopy, previous pt of Dr. Tiffany Kocher - Ambulatory referral to Gastroenterology  9. Colon cancer screening  - Ambulatory referral to Gastroenterology  10. Prescription for Shingrix. Vaccine not administered in office.   - Zoster Vaccine Adjuvanted [facility]) injection; Inject 0.5 mLs into the muscle once for 1 dose.  Dispense: 0.5 mL; Refill: 0  11. Need for pneumococcal vaccine - Pneumococcal conjugate vaccine 20-valent (Prevnar 20)          Arthur Huh, MD  [facility] [phone] (phone) [phone] (fax)  [place]

## 2021-11-24 LAB — LIPASE: Lipase: 38 U/L (ref 13–78)

## 2021-11-24 LAB — CBC
Hematocrit: 42.8 % (ref 37.5–51.0)
Hemoglobin: 15.3 g/dL (ref 13.0–17.7)
MCH: 32.3 pg (ref 26.6–33.0)
MCHC: 35.7 g/dL (ref 31.5–35.7)
MCV: 90 fL (ref 79–97)
Platelets: 158 10*3/uL (ref 150–450)
RBC: 4.74 x10E6/uL (ref 4.14–5.80)
RDW: 12 % (ref 11.6–15.4)
WBC: 6.4 10*3/uL (ref 3.4–10.8)

## 2021-11-24 LAB — LIPID PANEL
Chol/HDL Ratio: 3.8 ratio (ref 0.0–5.0)
Cholesterol, Total: 165 mg/dL (ref 100–199)
HDL: 43 mg/dL (ref >39)
LDL Chol Calc (NIH): 96 mg/dL (ref 0–99)
Triglycerides: 147 mg/dL (ref 0–149)
VLDL Cholesterol Cal: 26 mg/dL (ref 5–40)

## 2021-11-24 LAB — PSA TOTAL (REFLEX TO FREE): Prostate Specific Ag, Serum: 1.5 ng/mL (ref 0.0–4.0)

## 2021-11-24 LAB — AMYLASE: Amylase: 54 U/L (ref 31–110)

## 2021-11-24 LAB — COMPREHENSIVE METABOLIC PANEL
ALT: 15 IU/L (ref 0–44)
AST: 14 IU/L (ref 0–40)
Albumin/Globulin Ratio: 3.2 — ABNORMAL HIGH (ref 1.2–2.2)
Albumin: 4.8 g/dL (ref 3.8–4.8)
Alkaline Phosphatase: 67 IU/L (ref 44–121)
BUN/Creatinine Ratio: 10 (ref 10–24)
BUN: 11 mg/dL (ref 8–27)
Bilirubin Total: 1.1 mg/dL (ref 0.0–1.2)
CO2: 26 mmol/L (ref 20–29)
Calcium: 9.3 mg/dL (ref 8.6–10.2)
Chloride: 104 mmol/L (ref 96–106)
Creatinine, Ser: 1.14 mg/dL (ref 0.76–1.27)
Globulin, Total: 1.5 g/dL (ref 1.5–4.5)
Glucose: 115 mg/dL — ABNORMAL HIGH (ref 70–99)
Potassium: 4 mmol/L (ref 3.5–5.2)
Sodium: 142 mmol/L (ref 134–144)
Total Protein: 6.3 g/dL (ref 6.0–8.5)
eGFR: 71 mL/min/{1.73_m2} (ref >59)

## 2021-11-24 LAB — HEMOGLOBIN A1C
Est. average glucose Bld gHb Est-mCnc: 111 mg/dL
Hgb A1c MFr Bld: 5.5 % (ref 4.8–5.6)

## 2021-11-24 LAB — H. PYLORI BREATH TEST: H pylori Breath Test: NEGATIVE

## 2021-11-24 LAB — VITAMIN D 25 HYDROXY (VIT D DEFICIENCY, FRACTURES): Vit D, 25-Hydroxy: 83.3 ng/mL (ref 30.0–100.0)

## 2021-11-25 ENCOUNTER — Other Ambulatory Visit: Payer: Self-pay | Admitting: Family Medicine

## 2021-11-25 DIAGNOSIS — R1010 Upper abdominal pain, unspecified: Secondary | ICD-10-CM

## 2021-11-29 ENCOUNTER — Ambulatory Visit: Payer: Medicare Other | Admitting: Family Medicine

## 2021-12-02 ENCOUNTER — Ambulatory Visit
Admission: RE | Admit: 2021-12-02 | Discharge: 2021-12-02 | Disposition: A | Discharge status: Home / Self Care | Payer: Medicare Other | Source: Ambulatory Visit | Attending: Family Medicine | Admitting: Family Medicine

## 2021-12-02 ENCOUNTER — Other Ambulatory Visit: Payer: Self-pay

## 2021-12-02 DIAGNOSIS — R1010 Upper abdominal pain, unspecified: Secondary | ICD-10-CM | POA: Insufficient documentation

## 2021-12-06 ENCOUNTER — Other Ambulatory Visit: Payer: Self-pay | Admitting: Family Medicine

## 2021-12-06 DIAGNOSIS — I10 Essential (primary) hypertension: Secondary | ICD-10-CM

## 2021-12-07 ENCOUNTER — Telehealth: Payer: Self-pay

## 2021-12-07 NOTE — Telephone Encounter (Signed)
We checked pancreatic functions with his blood work and they were completely normal. The pancrease will typically only be seen on ultrasound if there is a problem such as a tumor or mass. Considering that the labs were normal an nothing was seen on the ultrasound, pancreatic diseases or pretty well ruled out, but he should still follow up with GI if he is still having any symptoms.

## 2021-12-07 NOTE — Telephone Encounter (Signed)
Copied from [facility] [id number]. Topic: General - Other >> Dec 07, 2021 10:04 AM Alanda Slim E wrote: Reason for CRM: Pt is calling to speak with nurse or Dr. Caryn Section about the last test and lab results and discuss new orders and test needed/ please advise

## 2021-12-07 NOTE — Telephone Encounter (Signed)
Patient has questions about his ultrasound report. He states that when he was seen in the office on 11/23/2021 he thought that Dr. Caryn Section mentioned that the pain could be coming from his pancreas. Patient didn't see where the ultrasound report visualized his pancreas. Patient says the location they did the ultrasound is not the same location of his pain. He states his pain in directly above his belly button, which he says is lower than his stomach. Patient would like to know if the pancreas was supposed to be included on the ultrasound and whether that could be causing his pain. Patient has an appointment with [facility] GI on 12/30/2021 for a consultation for Colonoscopy. Patient wants to know if he needs another referral sent to [facility] clinic for the Endoscopy that Dr. Caryn Section recommended?

## 2021-12-08 NOTE — Telephone Encounter (Signed)
Tried calling patient. He answered the phone and advised me that he would have to call back later. OK for [facility] triage to speak with patient abd advise of Dr. Maralyn Sago message.

## 2021-12-08 NOTE — Telephone Encounter (Signed)
Patient returned call- advised of PCP response. He has GI appointment 12/30/21 and will discuss his pain and other concerns at that appointment.

## 2021-12-30 ENCOUNTER — Ambulatory Visit
Admission: RE | Admit: 2021-12-30 | Discharge: 2021-12-30 | Disposition: A | Discharge status: Home / Self Care | Payer: Medicare Other | Source: Ambulatory Visit | Attending: Nurse Practitioner | Admitting: Nurse Practitioner

## 2021-12-30 ENCOUNTER — Other Ambulatory Visit: Payer: Self-pay | Admitting: Nurse Practitioner

## 2021-12-30 DIAGNOSIS — R1031 Right lower quadrant pain: Secondary | ICD-10-CM

## 2021-12-30 DIAGNOSIS — R1084 Generalized abdominal pain: Secondary | ICD-10-CM | POA: Insufficient documentation

## 2021-12-30 MED ORDER — IOHEXOL 300 MG/ML  SOLN
100.0000 mL | Freq: Once | INTRAMUSCULAR | Status: AC | PRN
  Administered 2021-12-30: 100 mL via INTRAVENOUS

## 2022-01-10 ENCOUNTER — Encounter: Payer: Self-pay | Admitting: Family Medicine

## 2022-01-10 ENCOUNTER — Telehealth (INDEPENDENT_AMBULATORY_CARE_PROVIDER_SITE_OTHER): Payer: Medicare Other | Admitting: Family Medicine

## 2022-01-10 ENCOUNTER — Other Ambulatory Visit: Payer: Self-pay

## 2022-01-10 DIAGNOSIS — M4316 Spondylolisthesis, lumbar region: Secondary | ICD-10-CM

## 2022-01-10 DIAGNOSIS — N400 Enlarged prostate without lower urinary tract symptoms: Secondary | ICD-10-CM

## 2022-01-10 NOTE — Progress Notes (Incomplete)
MyChart Video Visit    Virtual Visit via Video Note   This visit type was conducted due to national recommendations for restrictions regarding the COVID-19 Pandemic (e.g. social distancing) in an effort to limit this patient's exposure and mitigate transmission in our community. This patient is at least at moderate risk for complications without adequate follow up. This format is felt to be most appropriate for this patient at this time. Physical exam was limited by quality of the video and audio technology used for the visit.   Patient location: home Provider location: bfp  I discussed the limitations of evaluation and management by telemedicine and the availability of in person appointments. The patient expressed understanding and agreed to proceed.  Patient: Arthur Davis   DOB: [birth year]   66 y.o. Male  MRN: [id number] Visit Date: 01/10/2022  Today's healthcare provider: Lelon Huh, MD   Chief Complaint  Patient presents with   Back Pain   Subjective    Back Pain The current episode started more than 1 year ago. The problem is unchanged. The pain is present in the lumbar spine.   Patient reports that his recent abdominal CT scan showed abnormalities and was advised to follow up with his PCP. He was told by ordering GI that there was something going on with lumbar spine.   Review of abdominal CT report from 12-30-2021 finds splenomegaly dating to 2013, 78m focal enhancement of hepatic segment (recommend consider hepatic protocol MRI), and bilateral renal cysts.  MS finds Degenerative grade 1 L4 on L5 anterolisthesis   Medications: Outpatient Medications Prior to Visit  Medication Sig   albuterol (VENTOLIN HFA) 108 (90 Base) MCG/ACT inhaler Inhale 2 puffs into the lungs every 6 (six) hours as needed for wheezing or shortness of breath.   amLODipine (NORVASC) 2.5 MG tablet TAKE ONE (1) TABLET BY MOUTH ONCE DAILY   Cholecalciferol (VITAMIN D3) 2000 UNITS TABS Take 1  tablet by mouth daily. Takes 1 tablet 2 or 3 times per week.   Flaxseed, Linseed, (RA FLAX SEED OIL 1000 PO) Take 1 capsule by mouth daily.   GRAPE SEED EXTRACT PO Take by mouth.   irbesartan-hydrochlorothiazide (AVALIDE) 150-12.5 MG tablet TAKE ONE (1) TABLET BY MOUTH ONCE DAILY   Magnesium Oxide 500 MG (LAX) TABS Take Over The Counter Magnesium Oxide 400 to 600 mg per day (if develop diarrhea - try magnesium glycinate 400 to 600 mg per day)   metFORMIN (GLUCOPHAGE-XR) 500 MG 24 hr tablet TAKE ONE (1) TABLET BY MOUTH ONCE DAILY   Omega-3 Fatty Acids (FISH OIL CONCENTRATE) 1000 MG CAPS Take 1 capsule by mouth daily.    omeprazole (PRILOSEC) 40 MG capsule Take 1 capsule (40 mg total) by mouth daily.   topiramate (TOPAMAX) 50 MG tablet TAKE (1) TABLET BY MOUTH EVERY DAY   zolpidem (AMBIEN) 10 MG tablet Take 1 tablet (10 mg total) by mouth at bedtime. 1/2-1 tablet oral at bedtime.   No facility-administered medications prior to visit.    Review of Systems  Musculoskeletal:  Positive for back pain.   {Labs   Heme   Chem   Endocrine   Serology   Results Review (optional):23779}  Objective    There were no vitals taken for this visit. {Show previous vital signs (optional):23777}  Physical Exam   Awake, alert, oriented x 3. In no apparent distress   Assessment & Plan     1. Anterolisthesis of lumbar spine Minimally symptomatic. Was previously put on back  exercise regiment by chiropractor and encourage to get on ongoing   2. Benign prostatic hyperplasia, unspecified whether lower urinary tract symptoms present ***     I discussed the assessment and treatment plan with the patient. The patient was provided an opportunity to ask questions and all were answered. The patient agreed with the plan and demonstrated an understanding of the instructions.   The patient was advised to call back or seek an in-person evaluation if the symptoms worsen or if the condition fails to improve as  anticipated.  I provided *** minutes of non-face-to-face time during this encounter.  {provider attestation***:1}  Lelon Huh, MD [facility] [phone] (phone) [phone] (fax)  [place]

## 2022-01-12 ENCOUNTER — Other Ambulatory Visit: Payer: Self-pay | Admitting: Nurse Practitioner

## 2022-01-12 DIAGNOSIS — R932 Abnormal findings on diagnostic imaging of liver and biliary tract: Secondary | ICD-10-CM

## 2022-01-12 DIAGNOSIS — K769 Liver disease, unspecified: Secondary | ICD-10-CM

## 2022-01-12 DIAGNOSIS — R1084 Generalized abdominal pain: Secondary | ICD-10-CM

## 2022-01-24 ENCOUNTER — Other Ambulatory Visit: Payer: Self-pay

## 2022-01-24 ENCOUNTER — Ambulatory Visit
Admission: RE | Admit: 2022-01-24 | Discharge: 2022-01-24 | Disposition: A | Discharge status: Home / Self Care | Payer: Medicare Other | Source: Ambulatory Visit | Attending: Nurse Practitioner | Admitting: Nurse Practitioner

## 2022-01-24 DIAGNOSIS — K769 Liver disease, unspecified: Secondary | ICD-10-CM | POA: Insufficient documentation

## 2022-01-24 DIAGNOSIS — R932 Abnormal findings on diagnostic imaging of liver and biliary tract: Secondary | ICD-10-CM | POA: Diagnosis present

## 2022-01-24 DIAGNOSIS — R1084 Generalized abdominal pain: Secondary | ICD-10-CM | POA: Insufficient documentation

## 2022-01-24 MED ORDER — GADOBUTROL 1 MMOL/ML IV SOLN
9.0000 mL | Freq: Once | INTRAVENOUS | Status: AC | PRN
  Administered 2022-01-24: 9 mL via INTRAVENOUS

## 2022-01-27 ENCOUNTER — Ambulatory Visit (INDEPENDENT_AMBULATORY_CARE_PROVIDER_SITE_OTHER): Payer: Medicare Other

## 2022-01-27 VITALS — Wt 211.0 lb

## 2022-01-27 DIAGNOSIS — Z0000 Encounter for general adult medical examination without abnormal findings: Secondary | ICD-10-CM | POA: Diagnosis not present

## 2022-01-27 NOTE — Progress Notes (Signed)
? ?Subjective:  ? Arthur Davis is a 66 y.o. male who presents for an Initial Medicare Annual Wellness Visit. ? ?Review of Systems    ? ?  ? ?   ?Objective:  ?  ?There were no vitals filed for this visit. ?There is no height or weight on file to calculate BMI. ? ? ?  07/20/2021  ?  3:07 PM 03/06/2021  ?  3:39 PM 10/23/2019  ? 11:12 PM 10/23/2019  ?  8:52 PM  ?Advanced Directives  ?Does Patient Have a Medical Advance Directive? No Yes Yes Yes  ?Type of Corporate treasurer of [place];Living will Living will [place];Living will  ?Copy of [place] in Chart?    No - copy requested  ?Would patient like information on creating a medical advance directive?    No - Patient declined  ? ? ?Current Medications (verified) ?Outpatient Encounter Medications as of 01/27/2022  ?Medication Sig  ? albuterol (VENTOLIN HFA) 108 (90 Base) MCG/ACT inhaler Inhale 2 puffs into the lungs every 6 (six) hours as needed for wheezing or shortness of breath.  ? amLODipine (NORVASC) 2.5 MG tablet TAKE ONE (1) TABLET BY MOUTH ONCE DAILY  ? Cholecalciferol (VITAMIN D3) 2000 UNITS TABS Take 1 tablet by mouth daily. Takes 1 tablet 2 or 3 times per week.  ? Flaxseed, Linseed, (RA FLAX SEED OIL 1000 PO) Take 1 capsule by mouth daily.  ? GRAPE SEED EXTRACT PO Take by mouth.  ? irbesartan-hydrochlorothiazide (AVALIDE) 150-12.5 MG tablet TAKE ONE (1) TABLET BY MOUTH ONCE DAILY  ? Magnesium Oxide 500 MG (LAX) TABS Take Over The Counter Magnesium Oxide 400 to 600 mg per day (if develop diarrhea - try magnesium glycinate 400 to 600 mg per day)  ? metFORMIN (GLUCOPHAGE-XR) 500 MG 24 hr tablet TAKE ONE (1) TABLET BY MOUTH ONCE DAILY  ? naproxen (NAPROSYN) 500 MG tablet naproxen 500 mg tablet  ? Omega-3 Fatty Acids (FISH OIL CONCENTRATE) 1000 MG CAPS Take 1 capsule by mouth daily.   ? omeprazole (PRILOSEC) 40 MG capsule Take 1 capsule (40 mg total) by mouth daily.  ? polyethylene glycol-electrolytes  (NULYTELY) 420 g solution Take 4,000 mLs by mouth as directed.  ? [place] 500 MG CAPS Take by mouth.  ? topiramate (TOPAMAX) 50 MG tablet TAKE (1) TABLET BY MOUTH EVERY DAY  ? zolpidem (AMBIEN) 10 MG tablet Take 1 tablet (10 mg total) by mouth at bedtime. 1/2-1 tablet oral at bedtime.  ? ?No facility-administered encounter medications on file as of 01/27/2022.  ? ? ?Allergies (verified) ?Sulfa antibiotics  ? ?History: ?Past Medical History:  ?Diagnosis Date  ? History of colonic polyps 05/09/2006  ? Hypertension 03/20/2000  ? Pre-diabetes   ? ?Past Surgical History:  ?Procedure Laterality Date  ? APPENDECTOMY  1960  ? BRAIN SURGERY  1960's  ? Removed pressure from brain caused by head injury  ? ESOPHAGOGASTRODUODENOSCOPY N/A 10/23/2019  ? Procedure: ESOPHAGOGASTRODUODENOSCOPY (EGD);  Surgeon: Lucilla Lame, MD;  Location[facility] ENDOSCOPY;  Service: Endoscopy;  Laterality: N/A;  ? nasal abscess  1960  ? no information provider  ? TONSILLECTOMY AND ADENOIDECTOMY  1960  ? UPPER GI ENDOSCOPY  02/20/12  ? [facility], Normal Esophagus, Nornal Stomach, Normal duodenum, Dr. Candace Cruise; Dilated for dysphagia  ? UPPER GI ENDOSCOPY  09/16/2013  ? Dr. Tiffany Kocher; Changes suspicious for eosinophilic esophaigitis. Normal stomach and duodenum  ? VASECTOMY  1990  ? ?Family History  ?Problem Relation Age of  Onset  ? Hypertension Mother   ? Diabetes Brother   ? Heart disease Brother   ? Heart attack Brother   ? ?Social History  ? ?Socioeconomic History  ? Marital status: Married  ?  Spouse name: Not on file  ? Number of children: 5  ? Years of education: Not on file  ? Highest education level: Not on file  ?Occupational History  ? Occupation: HVAC work   ?Tobacco Use  ? Smoking status: Never  ? Smokeless tobacco: Never  ?Vaping Use  ? Vaping Use: Never used  ?Substance and Sexual Activity  ? Alcohol use: Yes  ?  Alcohol/week: 0.0 standard drinks  ?  Comment: rare  ? Drug use: No  ? Sexual activity: Not on file  ?Other Topics Concern  ? Not on file   ?Social History Narrative  ? Not on file  ? ?Social Determinants of Health  ? ?Financial Resource Strain: Not on file  ?Food Insecurity: Not on file  ?Transportation Needs: Not on file  ?Physical Activity: Not on file  ?Stress: Not on file  ?Social Connections: Not on file  ? ? ?Tobacco Counseling ?Counseling given: Not Answered ? ? ?Clinical Intake: ? ?Pre-visit preparation completed: Yes ? ?Pain : No/denies pain ? ?  ? ?Nutritional Risks: None ?Diabetes: No ? ?How often do you need to have someone help you when you read instructions, pamphlets, or other written materials from your doctor or pharmacy?: 1 - Never ? ?Diabetic?prediabetic ? ?Interpreter Needed?: No ? ?Information entered by :: Kirke Shaggy, LPN ? ? ?Activities of Daily Living ? ?  01/27/2022  ? 10:51 AM 11/23/2021  ?  9:48 AM  ?In your present state of health, do you have any difficulty performing the following activities:  ?Hearing? 0 0  ?Vision? 0 0  ?Difficulty concentrating or making decisions? 0 0  ?Walking or climbing stairs? 0 0  ?Dressing or bathing? 0 0  ?Doing errands, shopping? 0 0  ?Preparing Food and eating ? N   ?Using the Toilet? N   ?In the past six months, have you accidently leaked urine? N   ?Do you have problems with loss of bowel control? N   ?Managing your Medications? N   ?Managing your Finances? N   ?Housekeeping or managing your Housekeeping? N   ? ? ?Patient Care Team: ?Birdie Sons, MD as PCP - General (Family Medicine) ?Minna Merritts, MD as Consulting Physician (Cardiology) ? ?Indicate any recent Medical Services you may have received from other than [facility] providers in the past year (date may be approximate). ? ?   ?Assessment:  ? This is a routine wellness examination for Arthur Davis. ? ?Hearing/Vision screen ?No results found. ? ?Dietary issues and exercise activities discussed: ?  ? ? Goals Addressed   ?None ?  ? ?Depression Screen ? ?  11/23/2021  ?  9:47 AM 01/15/2021  ? 10:20 AM 03/18/2020  ? 10:22 AM 11/22/2017  ? 10:17  AM 11/22/2016  ?  2:21 PM 07/13/2015  ?  2:16 PM  ?PHQ 2/9 Scores  ?PHQ - 2 Score '1 2 1 '$ 0 0 0  ?PHQ- 9 Score '2 8 8 '$ 0 3 5  ?  ?Fall Risk ? ?  01/27/2022  ? 10:51 AM 11/23/2021  ?  9:47 AM 01/15/2021  ? 10:19 AM 03/18/2020  ? 10:22 AM  ?Fall Risk   ?Falls in the past year? 0 0 0 0  ?Number falls in past yr: 0 0 0  0  ?Injury with Fall? 0 0 0 0  ?Risk for fall due to :  No Fall Risks    ?Follow up    Falls evaluation completed  ? ? ?FALL RISK PREVENTION PERTAINING TO THE HOME: ? ?Any stairs in or around the home? No  ?If so, are there any without handrails? No  ?Home free of loose throw rugs in walkways, pet beds, electrical cords, etc? Yes  ?Adequate lighting in your home to reduce risk of falls? Yes  ? ?ASSISTIVE DEVICES UTILIZED TO PREVENT FALLS: ? ?Life alert? No  ?Use of a cane, walker or w/c? No  ?Grab bars in the bathroom? No  ?Shower chair or bench in shower? No  ?Elevated toilet seat or a handicapped toilet? No  ? ? ?Cognitive Function:Normal cognitive status assessed by direct observation by this Nurse Health Advisor. No abnormalities found.  ? ?  ?  ?  ? ?Immunizations ?Immunization History  ?Administered Date(s) Administered  ? Fluad Quad(high Dose 65+) 08/06/2020, 10/06/2021  ? Influenza Inj Mdck Quad With Preservative 10/07/2019  ? Influenza,inj,Quad PF,6+ Mos 11/22/2016, 07/05/2017  ? Moderna SARS-COV2 Booster Vaccination 10/06/2021  ? PFIZER(Purple Top)SARS-COV-2 Vaccination 01/05/2020, 01/26/2020, 08/27/2020  ? PNEUMOCOCCAL CONJUGATE-20 11/23/2021  ? Pneumococcal Polysaccharide-23 09/29/2020  ? Tdap 05/12/2012  ? Zoster, Live 07/13/2015  ? ? ?TDAP status: Up to date ? ?Flu Vaccine status: Up to date ? ?Pneumococcal vaccine status: Up to date ? ?Covid-19 vaccine status: Completed vaccines ? ?Qualifies for Shingles Vaccine? Yes   ?Zostavax completed Yes   ?Shingrix Completed?: No.    Education has been provided regarding the importance of this vaccine. Patient has been advised to call insurance company to  determine out of pocket expense if they have not yet received this vaccine. Advised may also receive vaccine at local pharmacy or Health Dept. Verbalized acceptance and understanding. ? ?Screening Tests ?Health M

## 2022-01-27 NOTE — Patient Instructions (Signed)
Arthur Davis , ?Thank you for taking time to come for your Medicare Wellness Visit. I appreciate your ongoing commitment to your health goals. Please review the following plan we discussed and let me know if I can assist you in the future.  ? ?Screening recommendations/referrals: ?Colonoscopy: 02/14/17, has on scheduled for May 26 ?Recommended yearly ophthalmology/optometry visit for glaucoma screening and checkup ?Recommended yearly dental visit for hygiene and checkup ? ?Vaccinations: ?Influenza vaccine: 10/06/21 ?Pneumococcal vaccine: 11/23/21 ?Tdap vaccine: 05/12/12 ?Shingles vaccine: Zostavax 07/13/15   ?Covid-19: 01/05/20, 01/26/20, 08/27/20, 10/06/21 ? ?Advanced directives: yes, requested copy ? ?Conditions/risks identified: none ? ?Next appointment: Follow up in one year for your annual wellness visit. 02/01/23 @ 1:45pm by phone ? ?Preventive Care 67 Years and Older, Male ?Preventive care refers to lifestyle choices and visits with your health care provider that can promote health and wellness. ?What does preventive care include? ?A yearly physical exam. This is also called an annual well check. ?Dental exams once or twice a year. ?Routine eye exams. Ask your health care provider how often you should have your eyes checked. ?Personal lifestyle choices, including: ?Daily care of your teeth and gums. ?Regular physical activity. ?Eating a healthy diet. ?Avoiding tobacco and drug use. ?Limiting alcohol use. ?Practicing safe sex. ?Taking low doses of aspirin every day. ?Taking vitamin and mineral supplements as recommended by your health care provider. ?What happens during an annual well check? ?The services and screenings done by your health care provider during your annual well check will depend on your age, overall health, lifestyle risk factors, and family history of disease. ?Counseling  ?Your health care provider may ask you questions about your: ?Alcohol use. ?Tobacco use. ?Drug use. ?Emotional well-being. ?Home and  relationship well-being. ?Sexual activity. ?Eating habits. ?History of falls. ?Memory and ability to understand (cognition). ?Work and work Statistician. ?Screening  ?You may have the following tests or measurements: ?Height, weight, and BMI. ?Blood pressure. ?Lipid and cholesterol levels. These may be checked every 5 years, or more frequently if you are over 24 years old. ?Skin check. ?Lung cancer screening. You may have this screening every year starting at age 71 if you have a 30-pack-year history of smoking and currently smoke or have quit within the past 15 years. ?Fecal occult blood test (FOBT) of the stool. You may have this test every year starting at age 84. ?Flexible sigmoidoscopy or colonoscopy. You may have a sigmoidoscopy every 5 years or a colonoscopy every 10 years starting at age 71. ?Prostate cancer screening. Recommendations will vary depending on your family history and other risks. ?Hepatitis C blood test. ?Hepatitis B blood test. ?Sexually transmitted disease (STD) testing. ?Diabetes screening. This is done by checking your blood sugar (glucose) after you have not eaten for a while (fasting). You may have this done every 1-3 years. ?Abdominal aortic aneurysm (AAA) screening. You may need this if you are a current or former smoker. ?Osteoporosis. You may be screened starting at age 18 if you are at high risk. ?Talk with your health care provider about your test results, treatment options, and if necessary, the need for more tests. ?Vaccines  ?Your health care provider may recommend certain vaccines, such as: ?Influenza vaccine. This is recommended every year. ?Tetanus, diphtheria, and acellular pertussis (Tdap, Td) vaccine. You may need a Td booster every 10 years. ?Zoster vaccine. You may need this after age 35. ?Pneumococcal 13-valent conjugate (PCV13) vaccine. One dose is recommended after age 48. ?Pneumococcal polysaccharide (PPSV23) vaccine. One dose is  recommended after age 18. ?Talk to your  health care provider about which screenings and vaccines you need and how often you need them. ?This information is not intended to replace advice given to you by your health care provider. Make sure you discuss any questions you have with your health care provider. ?Document Released: 11/13/2015 Document Revised: 07/06/2016 Document Reviewed: 08/18/2015 ?Elsevier Interactive Patient Education ? 2017 [place]. ? ?Fall Prevention in the Home ?Falls can cause injuries. They can happen to people of all ages. There are many things you can do to make your home safe and to help prevent falls. ?What can I do on the outside of my home? ?Regularly fix the edges of walkways and driveways and fix any cracks. ?Remove anything that might make you trip as you walk through a door, such as a raised step or threshold. ?Trim any bushes or trees on the path to your home. ?Use bright outdoor lighting. ?Clear any walking paths of anything that might make someone trip, such as rocks or tools. ?Regularly check to see if handrails are loose or broken. Make sure that both sides of any steps have handrails. ?Any raised decks and porches should have guardrails on the edges. ?Have any leaves, snow, or ice cleared regularly. ?Use sand or salt on walking paths during winter. ?Clean up any spills in your garage right away. This includes oil or grease spills. ?What can I do in the bathroom? ?Use night lights. ?Install grab bars by the toilet and in the tub and shower. Do not use towel bars as grab bars. ?Use non-skid mats or decals in the tub or shower. ?If you need to sit down in the shower, use a plastic, non-slip stool. ?Keep the floor dry. Clean up any water that spills on the floor as soon as it happens. ?Remove soap buildup in the tub or shower regularly. ?Attach bath mats securely with double-sided non-slip rug tape. ?Do not have throw rugs and other things on the floor that can make you trip. ?What can I do in the bedroom? ?Use night  lights. ?Make sure that you have a light by your bed that is easy to reach. ?Do not use any sheets or blankets that are too big for your bed. They should not hang down onto the floor. ?Have a firm chair that has side arms. You can use this for support while you get dressed. ?Do not have throw rugs and other things on the floor that can make you trip. ?What can I do in the kitchen? ?Clean up any spills right away. ?Avoid walking on wet floors. ?Keep items that you use a lot in easy-to-reach places. ?If you need to reach something above you, use a strong step stool that has a grab bar. ?Keep electrical cords out of the way. ?Do not use floor polish or wax that makes floors slippery. If you must use wax, use non-skid floor wax. ?Do not have throw rugs and other things on the floor that can make you trip. ?What can I do with my stairs? ?Do not leave any items on the stairs. ?Make sure that there are handrails on both sides of the stairs and use them. Fix handrails that are broken or loose. Make sure that handrails are as long as the stairways. ?Check any carpeting to make sure that it is firmly attached to the stairs. Fix any carpet that is loose or worn. ?Avoid having throw rugs at the top or bottom of the stairs. If  you do have throw rugs, attach them to the floor with carpet tape. ?Make sure that you have a light switch at the top of the stairs and the bottom of the stairs. If you do not have them, ask someone to add them for you. ?What else can I do to help prevent falls? ?Wear shoes that: ?Do not have high heels. ?Have rubber bottoms. ?Are comfortable and fit you well. ?Are closed at the toe. Do not wear sandals. ?If you use a stepladder: ?Make sure that it is fully opened. Do not climb a closed stepladder. ?Make sure that both sides of the stepladder are locked into place. ?Ask someone to hold it for you, if possible. ?Clearly mark and make sure that you can see: ?Any grab bars or handrails. ?First and last  steps. ?Where the edge of each step is. ?Use tools that help you move around (mobility aids) if they are needed. These include: ?Canes. ?Walkers. ?Scooters. ?Crutches. ?Turn on the lights when you go into a d

## 2022-02-18 NOTE — Progress Notes (Signed)
?Virtual Visit via Telephone Note ? ?I connected with  Arthur Davis on 02/18/22 at  1:00 PM EDT by telephone and verified that I am speaking with the correct person using two identifiers. ? ?Location: ?Patient: home ?Provider: [facility] ?Persons participating in the virtual visit: patient/Nurse Health Advisor ?  ?I discussed the limitations, risks, security and privacy concerns of performing an evaluation and management service by telephone and the availability of in person appointments. The patient expressed understanding and agreed to proceed. ? ?Interactive audio and video telecommunications were attempted between this nurse and patient, however failed, due to patient having technical difficulties OR patient did not have access to video capability.  We continued and completed visit with audio only. ? ?Some vital signs may be absent or patient reported.  ? ?Arthur David, LPN ? ?Subjective:  ? Arthur Davis is a 66 y.o. male who presents for an Initial Medicare Annual Wellness Visit. ? ?Review of Systems    ? ?Cardiac Risk Factors include: advanced age (>39mn, >>74women);male gender ? ?   ?Objective:  ?  ?Today's Vitals  ? 01/27/22 1326  ?Weight: 211 lb (95.7 kg)  ? ?Body mass index is 30.28 kg/m?. ? ? ?  01/27/2022  ?  1:08 PM 07/20/2021  ?  3:07 PM 03/06/2021  ?  3:39 PM 10/23/2019  ? 11:12 PM 10/23/2019  ?  8:52 PM  ?Advanced Directives  ?Does Patient Have a Medical Advance Directive? Yes No Yes Yes Yes  ?Type of AParamedicof ANaplateLiving will  [place]goinsvilleLiving will Living will HClintonLiving will  ?Does patient want to make changes to medical advance directive? Yes (Inpatient - patient defers changing a medical advance directive and declines information at this time)      ?Copy of [place] Chart? No - copy requested    No - copy requested  ?Would patient like information on creating a medical advance directive?     No -  Patient declined  ? ? ?Current Medications (verified) ?Outpatient Encounter Medications as of 01/27/2022  ?Medication Sig  ? amLODipine (NORVASC) 2.5 MG tablet TAKE ONE (1) TABLET BY MOUTH ONCE DAILY  ? Cholecalciferol (VITAMIN D3) 2000 UNITS TABS Take 1 tablet by mouth daily. Takes 1 tablet 2 or 3 times per week.  ? Flaxseed, Linseed, (RA FLAX SEED OIL 1000 PO) Take 1 capsule by mouth daily.  ? GRAPE SEED EXTRACT PO Take by mouth.  ? irbesartan-hydrochlorothiazide (AVALIDE) 150-12.5 MG tablet TAKE ONE (1) TABLET BY MOUTH ONCE DAILY  ? Magnesium Oxide 500 MG (LAX) TABS Take Over The Counter Magnesium Oxide 400 to 600 mg per day (if develop diarrhea - try magnesium glycinate 400 to 600 mg per day)  ? metFORMIN (GLUCOPHAGE-XR) 500 MG 24 hr tablet TAKE ONE (1) TABLET BY MOUTH ONCE DAILY  ? Omega-3 Fatty Acids (FISH OIL CONCENTRATE) 1000 MG CAPS Take 1 capsule by mouth daily.   ? omeprazole (PRILOSEC) 40 MG capsule Take 1 capsule (40 mg total) by mouth daily.  ? topiramate (TOPAMAX) 50 MG tablet TAKE (1) TABLET BY MOUTH EVERY DAY  ? zolpidem (AMBIEN) 10 MG tablet Take 1 tablet (10 mg total) by mouth at bedtime. 1/2-1 tablet oral at bedtime.  ? albuterol (VENTOLIN HFA) 108 (90 Base) MCG/ACT inhaler Inhale 2 puffs into the lungs every 6 (six) hours as needed for wheezing or shortness of breath.  ? naproxen (NAPROSYN) 500 MG tablet naproxen 500  mg tablet (Patient not taking: Reported on 01/27/2022)  ? polyethylene glycol-electrolytes (NULYTELY) 420 g solution Take 4,000 mLs by mouth as directed. (Patient not taking: Reported on 01/27/2022)  ? Saw Palmetto 500 MG CAPS Take by mouth.  ? ?No facility-administered encounter medications on file as of 01/27/2022.  ? ? ?Allergies (verified) ?Sulfa antibiotics  ? ?History: ?Past Medical History:  ?Diagnosis Date  ? History of colonic polyps 05/09/2006  ? Hypertension 03/20/2000  ? Pre-diabetes   ? ?Past Surgical History:  ?Procedure Laterality Date  ? APPENDECTOMY  1960  ? BRAIN  SURGERY  1960's  ? Removed pressure from brain caused by head injury  ? ESOPHAGOGASTRODUODENOSCOPY N/A 10/23/2019  ? Procedure: ESOPHAGOGASTRODUODENOSCOPY (EGD);  Surgeon: Lucilla Lame, MD;  [facility] ENDOSCOPY;  Service: Endoscopy;  Laterality: N/A;  ? nasal abscess  1960  ? no information provider  ? TONSILLECTOMY AND ADENOIDECTOMY  1960  ? UPPER GI ENDOSCOPY  02/20/12  ? [facility], Normal Esophagus, Nornal Stomach, Normal duodenum, Dr. Candace Cruise; Dilated for dysphagia  ? UPPER GI ENDOSCOPY  09/16/2013  ? Dr. Tiffany Kocher; Changes suspicious for eosinophilic esophaigitis. Normal stomach and duodenum  ? VASECTOMY  1990  ? ?Family History  ?Problem Relation Age of Onset  ? Hypertension Mother   ? Diabetes Brother   ? Heart disease Brother   ? Heart attack Brother   ? ?Social History  ? ?Socioeconomic History  ? Marital status: Married  ?  Spouse name: Not on file  ? Number of children: 5  ? Years of education: Not on file  ? Highest education level: Not on file  ?Occupational History  ? Occupation: HVAC work   ?Tobacco Use  ? Smoking status: Never  ? Smokeless tobacco: Never  ?Vaping Use  ? Vaping Use: Never used  ?Substance and Sexual Activity  ? Alcohol use: Yes  ?  Alcohol/week: 0.0 standard drinks  ?  Comment: rare  ? Drug use: No  ? Sexual activity: Not on file  ?Other Topics Concern  ? Not on file  ?Social History Narrative  ? Not on file  ? ?Social Determinants of Health  ? ?Financial Resource Strain: Low Risk   ? Difficulty of Paying Living Expenses: Not hard at all  ?Food Insecurity: No Food Insecurity  ? Worried About Charity fundraiser in the Last Year: Never true  ? Ran Out of Food in the Last Year: Never true  ?Transportation Needs: No Transportation Needs  ? Lack of Transportation (Medical): No  ? Lack of Transportation (Non-Medical): No  ?Physical Activity: Insufficiently Active  ? Days of Exercise per Week: 4 days  ? Minutes of Exercise per Session: 20 min  ?Stress: No Stress Concern Present  ? Feeling of Stress  : Not at all  ?Social Connections: Moderately Isolated  ? Frequency of Communication with Friends and Family: Once a week  ? Frequency of Social Gatherings with Friends and Family: Once a week  ? Attends Religious Services: Never  ? Active Member of Clubs or Organizations: Yes  ? Attends Archivist Meetings: 1 to 4 times per year  ? Marital Status: Married  ? ? ?Tobacco Counseling ?Counseling given: Not Answered ? ? ?Clinical Intake: ? ?Pre-visit preparation completed: Yes ? ?Pain : No/denies pain ? ?  ? ?Nutritional Risks: None ?Diabetes: No ? ?How often do you need to have someone help you when you read instructions, pamphlets, or other written materials from your doctor or pharmacy?: 1 - Never ? ?Diabetic?prediabetic ? ?  Interpreter Needed?: No ? ?Information entered by :: Kirke Shaggy, LPN ? ? ?Activities of Daily Living ? ?  01/27/2022  ?  1:11 PM 01/27/2022  ? 10:51 AM  ?In your present state of health, do you have any difficulty performing the following activities:  ?Hearing? 0 0  ?Vision? 0 0  ?Difficulty concentrating or making decisions? 0 0  ?Walking or climbing stairs? 0 0  ?Dressing or bathing? 0 0  ?Doing errands, shopping? 0 0  ?Preparing Food and eating ? N N  ?Using the Toilet? N N  ?In the past six months, have you accidently leaked urine? N N  ?Do you have problems with loss of bowel control? N N  ?Managing your Medications? N N  ?Managing your Finances? N N  ?Housekeeping or managing your Housekeeping? N N  ? ? ?Patient Care Team: ?Birdie Sons, MD as PCP - General (Family Medicine) ?Minna Merritts, MD as Consulting Physician (Cardiology) ? ?Indicate any recent Medical Services you may have received from other than [facility] providers in the past year (date may be approximate). ? ?   ?Assessment:  ? This is a routine wellness examination for [place]. ? ?Hearing/Vision screen ?Hearing Screening - Comments:: No aids ?Vision Screening - Comments:: Wears readers- [employer] ? ?Dietary issues and  exercise activities discussed: ?Current Exercise Habits: Home exercise routine, Time (Minutes): 20, Frequency (Times/Week): 4, Weekly Exercise (Minutes/Week): 80, Intensity: Mild ? ? Goals Addressed   ? ?

## 2022-03-02 NOTE — Progress Notes (Signed)
?  ? ?I,Roshena L Chambers,acting as a scribe for Lelon Huh, MD.,have documented all relevant documentation on the behalf of Lelon Huh, MD,as directed by  Lelon Huh, MD while in the presence of Lelon Huh, MD.  ? ?Established patient visit ? ? ?Patient: Arthur Davis   DOB: [birth year]   66 y.o. Male  MRN: [id number] ?Visit Date: 03/04/2022 ? ?Today's healthcare provider: Lelon Huh, MD  ? ?Chief Complaint  ?Patient presents with  ? Hypertension  ? Prediabetes  ? ?Subjective  ?  ?HPI  ?Hypertension, follow-up ? ?BP Readings from Last 3 Encounters:  ?03/04/22 110/61  ?11/23/21 123/66  ?07/26/21 123/71  ? Wt Readings from Last 3 Encounters:  ?03/04/22 196 lb (88.9 kg)  ?01/27/22 211 lb (95.7 kg)  ?11/23/21 211 lb 1.6 oz (95.8 kg)  ?  ? ?He was last seen for hypertension 3 months ago.  ?BP at that visit was 123/66. Management since that visit includes continue same medication. ? ?He reports good compliance with treatment. ?He is not having side effects.  ?He is following a Regular diet. ?He is exercising. ?He does not smoke. ? ?Use of agents associated with hypertension: none.  ? ?Outside blood pressures are 120/70. ?Symptoms: ?No chest pain No chest pressure  ?No palpitations No syncope  ?No dyspnea No orthopnea  ?No paroxysmal nocturnal dyspnea No lower extremity edema  ? ?Pertinent labs ?Lab Results  ?Component Value Date  ? CHOL 165 11/23/2021  ? HDL 43 11/23/2021  ? [place] 96 11/23/2021  ? TRIG 147 11/23/2021  ? CHOLHDL 3.8 11/23/2021  ? Lab Results  ?Component Value Date  ? NA 142 11/23/2021  ? K 4.0 11/23/2021  ? CREATININE 1.14 11/23/2021  ? EGFR 71 11/23/2021  ? GLUCOSE 115 (H) 11/23/2021  ? TSH 1.002 07/20/2021  ?  ? ?The 10-year ASCVD risk score (Arnett DK, et al., 2019) is: 21.6% ? ?---------------------------------------------------------------------------------------------------  ? ?Prediabetes, Follow-up ? ?Lab Results  ?Component Value Date  ? HGBA1C 5.5 11/23/2021  ? HGBA1C 5.4  01/19/2021  ? HGBA1C 5.8 (H) 06/23/2020  ? GLUCOSE 115 (H) 11/23/2021  ? GLUCOSE 138 (H) 07/20/2021  ? GLUCOSE 122 (H) 01/19/2021  ? ? ?Last seen for for this 3 months ago.  ?Management since that visit includes continue same medication. ?Current symptoms include none and have been stable. ? ?Prior visit with dietician: no ?Current diet: well balanced intermittent fasting  ?Current exercise:  modified jump and jacks 20 minutes , twice a week ? ?Pertinent Labs: ?   ?Component Value Date/Time  ? CHOL 165 11/23/2021 1028  ? TRIG 147 11/23/2021 1028  ? CHOLHDL 3.8 11/23/2021 1028  ? CREATININE 1.14 11/23/2021 1028  ? CREATININE 1.41 (H) 10/25/2012 0424  ? ? ?Wt Readings from Last 3 Encounters:  ?03/04/22 196 lb (88.9 kg)  ?01/27/22 211 lb (95.7 kg)  ?11/23/21 211 lb 1.6 oz (95.8 kg)  ? ? ?-----------------------------------------------------------------------------------------  ? ?He also has long history of sleep apnea on CPAP since the 2000s. He purchased a replacement CPAP off [place] which he paid for himself a few years ago but it's not working correctly and he needs replacement parts. He felt CPAP was very beneficial when it was correctly.  ? ?Medications: ?Outpatient Medications Prior to Visit  ?Medication Sig  ? amLODipine (NORVASC) 2.5 MG tablet TAKE ONE (1) TABLET BY MOUTH ONCE DAILY  ? Cholecalciferol (VITAMIN D3) 2000 UNITS TABS Take 1 tablet by mouth daily. Takes 1 tablet 2 or 3 times per week.  ?  Flaxseed, Linseed, (RA FLAX SEED OIL 1000 PO) Take 1 capsule by mouth daily.  ? irbesartan-hydrochlorothiazide (AVALIDE) 150-12.5 MG tablet TAKE ONE (1) TABLET BY MOUTH ONCE DAILY  ? Magnesium Oxide 500 MG (LAX) TABS Take Over The Counter Magnesium Oxide 400 to 600 mg per day (if develop diarrhea - try magnesium glycinate 400 to 600 mg per day)  ? metFORMIN (GLUCOPHAGE-XR) 500 MG 24 hr tablet TAKE ONE (1) TABLET BY MOUTH ONCE DAILY  ? naproxen (NAPROSYN) 500 MG tablet   ? Omega-3 Fatty Acids (FISH OIL CONCENTRATE)  1000 MG CAPS Take 1 capsule by mouth daily.   ? omeprazole (PRILOSEC) 40 MG capsule Take 1 capsule (40 mg total) by mouth daily.  ? polyethylene glycol-electrolytes (NULYTELY) 420 g solution Take 4,000 mLs by mouth as directed.  ? topiramate (TOPAMAX) 50 MG tablet TAKE (1) TABLET BY MOUTH EVERY DAY  ? zolpidem (AMBIEN) 10 MG tablet Take 1 tablet (10 mg total) by mouth at bedtime. 1/2-1 tablet oral at bedtime.  ? albuterol (VENTOLIN HFA) 108 (90 Base) MCG/ACT inhaler Inhale 2 puffs into the lungs every 6 (six) hours as needed for wheezing or shortness of breath. (Patient not taking: Reported on 03/04/2022)  ? [DISCONTINUED] GRAPE SEED EXTRACT PO Take by mouth.  ? [DISCONTINUED] Saw Palmetto 500 MG CAPS Take by mouth.  ? ?No facility-administered medications prior to visit.  ? ? ?Review of Systems  ?Constitutional:  Negative for appetite change, chills and fever.  ?Respiratory:  Negative for chest tightness, shortness of breath and wheezing.   ?Cardiovascular:  Negative for chest pain and palpitations.  ?Gastrointestinal:  Negative for abdominal pain, nausea and vomiting.  ?Genitourinary:  Positive for frequency.  ? ? ?  Objective  ?  ?BP 110/61 (BP Location: Right Arm, Patient Position: Sitting, Cuff Size: Large)   Pulse (!) 58   Temp 97.8 ?F (36.6 ?C) (Oral)   Resp 16   Wt 196 lb (88.9 kg)   SpO2 98% Comment: room air  BMI 28.12 kg/m?  ? ? ?Physical Exam  ? ?General: Appearance:     ?Overweight male in no acute distress  ?Eyes:    PERRL, conjunctiva/corneas clear, EOM's intact       ?Lungs:     Clear to auscultation bilaterally, respirations unlabored  ?Heart:    Bradycardic. Normal rhythm. No murmurs, rubs, or gallops.    ?MS:   All extremities are intact.    ?Neurologic:   Awake, alert, oriented x 3. No apparent focal neurological defect.   ?   ?  ? Assessment & Plan  ?  ? ?1. Essential (primary) hypertension ?Very well controlled.  ? ?2. Chronic migraine without aura without status migrainosus, not  intractable ?Very well controlled on topiramate for years.  ? ?3. OSA on CPAP ?Current machine is not working correctly and needs replacement parts. Has not had follow up sleep study since he first got CPAP machine in the 2000s.  ?- Nocturnal polysomnography (NPSG); Future ? ?4. Prediabetes ? ?- Hemoglobin A1c ? ?5. Hypersomnia ? ?- Nocturnal polysomnography (NPSG); Future ? ?6. Hyperglyceridemia, pure ? ?- Comprehensive metabolic panel ?- Lipid panel ? ?7. Vitamin D deficiency ? ?- VITAMIN D 25 Hydroxy (Vit-D Deficiency, Fractures)  ?   ? ?The entirety of the information documented in the History of Present Illness, Review of Systems and Physical Exam were personally obtained by me. Portions of this information were initially documented by the CMA and reviewed by me for thoroughness and accuracy.   ? ? ?  Lelon Huh, MD  ?[facility] ?[phone] (phone) ?[phone] (fax) ? ?[facility]  ?

## 2022-03-04 ENCOUNTER — Ambulatory Visit (INDEPENDENT_AMBULATORY_CARE_PROVIDER_SITE_OTHER): Payer: Medicare Other | Admitting: Family Medicine

## 2022-03-04 ENCOUNTER — Encounter: Payer: Self-pay | Admitting: Family Medicine

## 2022-03-04 VITALS — BP 110/61 | HR 58 | Temp 97.8°F | Resp 16 | Wt 196.0 lb

## 2022-03-04 DIAGNOSIS — G43709 Chronic migraine without aura, not intractable, without status migrainosus: Secondary | ICD-10-CM

## 2022-03-04 DIAGNOSIS — E781 Pure hyperglyceridemia: Secondary | ICD-10-CM

## 2022-03-04 DIAGNOSIS — G4710 Hypersomnia, unspecified: Secondary | ICD-10-CM

## 2022-03-04 DIAGNOSIS — G4733 Obstructive sleep apnea (adult) (pediatric): Secondary | ICD-10-CM | POA: Diagnosis not present

## 2022-03-04 DIAGNOSIS — I10 Essential (primary) hypertension: Secondary | ICD-10-CM | POA: Diagnosis not present

## 2022-03-04 DIAGNOSIS — R7303 Prediabetes: Secondary | ICD-10-CM | POA: Diagnosis not present

## 2022-03-04 DIAGNOSIS — E559 Vitamin D deficiency, unspecified: Secondary | ICD-10-CM

## 2022-03-04 DIAGNOSIS — Z9989 Dependence on other enabling machines and devices: Secondary | ICD-10-CM

## 2022-03-04 NOTE — Patient Instructions (Signed)
.   Please review the attached list of medications and notify my office if there are any errors.   . Please bring all of your medications to every appointment so we can make sure that our medication list is the same as yours.   

## 2022-03-05 LAB — COMPREHENSIVE METABOLIC PANEL
ALT: 15 IU/L (ref 0–44)
AST: 17 IU/L (ref 0–40)
Albumin/Globulin Ratio: 2.9 — ABNORMAL HIGH (ref 1.2–2.2)
Albumin: 5 g/dL — ABNORMAL HIGH (ref 3.8–4.8)
Alkaline Phosphatase: 69 IU/L (ref 44–121)
BUN/Creatinine Ratio: 12 (ref 10–24)
BUN: 14 mg/dL (ref 8–27)
Bilirubin Total: 1.3 mg/dL — ABNORMAL HIGH (ref 0.0–1.2)
CO2: 25 mmol/L (ref 20–29)
Calcium: 9.7 mg/dL (ref 8.6–10.2)
Chloride: 102 mmol/L (ref 96–106)
Creatinine, Ser: 1.14 mg/dL (ref 0.76–1.27)
Globulin, Total: 1.7 g/dL (ref 1.5–4.5)
Glucose: 110 mg/dL — ABNORMAL HIGH (ref 70–99)
Potassium: 4.1 mmol/L (ref 3.5–5.2)
Sodium: 142 mmol/L (ref 134–144)
Total Protein: 6.7 g/dL (ref 6.0–8.5)
eGFR: 71 mL/min/{1.73_m2} (ref >59)

## 2022-03-05 LAB — HEMOGLOBIN A1C
Est. average glucose Bld gHb Est-mCnc: 105 mg/dL
Hgb A1c MFr Bld: 5.3 % (ref 4.8–5.6)

## 2022-03-05 LAB — LIPID PANEL
Chol/HDL Ratio: 3.5 ratio (ref 0.0–5.0)
Cholesterol, Total: 143 mg/dL (ref 100–199)
HDL: 41 mg/dL (ref >39)
LDL Chol Calc (NIH): 80 mg/dL (ref 0–99)
Triglycerides: 123 mg/dL (ref 0–149)
VLDL Cholesterol Cal: 22 mg/dL (ref 5–40)

## 2022-03-05 LAB — VITAMIN D 25 HYDROXY (VIT D DEFICIENCY, FRACTURES): Vit D, 25-Hydroxy: 118 ng/mL — ABNORMAL HIGH (ref 30.0–100.0)

## 2022-03-11 ENCOUNTER — Other Ambulatory Visit: Payer: Self-pay | Admitting: Family Medicine

## 2022-03-17 ENCOUNTER — Ambulatory Visit: Payer: Self-pay | Admitting: *Deleted

## 2022-03-17 NOTE — Telephone Encounter (Signed)
  Chief Complaint: Not given instructions on what he could eat/drink week prior to and day of his colonoscopy a week from tomorrow.    Told to call his PCP. Symptoms:  Frequency:  Pertinent Negatives: Patient denies  Disposition: '[]'$ ED /'[]'$ Urgent Care (no appt availability in office) / '[]'$ Appointment(In office/virtual)/ '[]'$  [place] [facility]/ '[x]'$ Home Care/ '[]'$ Refused Recommended Disposition /'[]'$ [place] [facility]/ '[]'$  Follow-up with PCP Additional Notes: Instructed him to call the GI doctor that is doing the colonoscopy for those directions because each dr. Has their prep criteria.  Pt agreeable to calling GI doctor.

## 2022-03-17 NOTE — Telephone Encounter (Signed)
I returned pt's call.  He is having a colonoscopy a week from tomorrow.  When he picked up the stuff to drink he wasn't given any instructions on eating and drinking the week before and the day of the procedure.    He was told to call his PCP.   Reason for Disposition  Colonoscopy, questions about  Answer Assessment - Initial Assessment Questions 1. DATE/TIME: "When did you have your colonoscopy?"      I need to know the diet I'm to do before the colonoscopy a week from tomorrow.    There's instruction for the prep but not the week before what to eat/drink or for the day of the procedure.   I called them and they said to call your PCP.      2. MAIN CONCERN: "What is your main concern right now?" "What questions do you have?"     I don't know what the diet instructions are for the week before the colonoscopy and the day of the procedure. When I picked up the stuff there wasn't any instructions other than how to do the prep.  3. ABDOMEN PAIN: "Are you having any abdomen (belly or stomach) pain?" If Yes, ask: "How bad is it?" (e.g., Scale 1-10; mild, moderate, severe).    - MILD (1-3): doesn't interfere with normal activities, abdomen soft and not tender to touch     - MODERATE (4-7): interferes with normal activities or awakens from sleep, abdomen tender to touch     - SEVERE (8-10): excruciating pain, doubled over, unable to do any normal activities        4. OTHER SYMPTOMS: "What other symptoms are you having?" (e.g., rectal bleeding, bloating or feeling gassy, passing gas, vomiting, dizziness, fever).      5. ONSET: "When did your symptoms start?"      6. PATTERN: "Is the symptom(s) constant or does it come and go?" "Is your symptom(s) getting worse, better, or staying the same?"  Protocols used: Colonoscopy Symptoms and Questions-A-AH

## 2022-03-24 ENCOUNTER — Encounter: Payer: Self-pay | Admitting: *Deleted

## 2022-03-25 ENCOUNTER — Ambulatory Visit: Payer: Medicare Other | Admitting: Anesthesiology

## 2022-03-25 ENCOUNTER — Encounter
Admission: RE | Disposition: A | Discharge status: Home / Self Care | Payer: Self-pay | Source: Home / Self Care | Attending: Gastroenterology

## 2022-03-25 ENCOUNTER — Ambulatory Visit
Admission: RE | Admit: 2022-03-25 | Discharge: 2022-03-25 | Disposition: A | Discharge status: Home / Self Care | Payer: Medicare Other | Attending: Gastroenterology | Admitting: Gastroenterology

## 2022-03-25 DIAGNOSIS — Z6828 Body mass index (BMI) 28.0-28.9, adult: Secondary | ICD-10-CM | POA: Insufficient documentation

## 2022-03-25 DIAGNOSIS — D123 Benign neoplasm of transverse colon: Secondary | ICD-10-CM | POA: Diagnosis not present

## 2022-03-25 DIAGNOSIS — K2289 Other specified disease of esophagus: Secondary | ICD-10-CM | POA: Insufficient documentation

## 2022-03-25 DIAGNOSIS — E669 Obesity, unspecified: Secondary | ICD-10-CM | POA: Insufficient documentation

## 2022-03-25 DIAGNOSIS — K640 First degree hemorrhoids: Secondary | ICD-10-CM | POA: Insufficient documentation

## 2022-03-25 DIAGNOSIS — Z86010 Personal history of colonic polyps: Secondary | ICD-10-CM | POA: Insufficient documentation

## 2022-03-25 DIAGNOSIS — Z1211 Encounter for screening for malignant neoplasm of colon: Secondary | ICD-10-CM | POA: Insufficient documentation

## 2022-03-25 DIAGNOSIS — K219 Gastro-esophageal reflux disease without esophagitis: Secondary | ICD-10-CM | POA: Insufficient documentation

## 2022-03-25 DIAGNOSIS — I10 Essential (primary) hypertension: Secondary | ICD-10-CM | POA: Insufficient documentation

## 2022-03-25 DIAGNOSIS — K635 Polyp of colon: Secondary | ICD-10-CM | POA: Insufficient documentation

## 2022-03-25 DIAGNOSIS — E119 Type 2 diabetes mellitus without complications: Secondary | ICD-10-CM | POA: Insufficient documentation

## 2022-03-25 DIAGNOSIS — R1013 Epigastric pain: Secondary | ICD-10-CM | POA: Insufficient documentation

## 2022-03-25 DIAGNOSIS — G4730 Sleep apnea, unspecified: Secondary | ICD-10-CM | POA: Diagnosis not present

## 2022-03-25 HISTORY — DX: Sleep apnea, unspecified: G47.30

## 2022-03-25 HISTORY — PX: ESOPHAGOGASTRODUODENOSCOPY (EGD) WITH PROPOFOL: SHX5813

## 2022-03-25 HISTORY — DX: Headache, unspecified: R51.9

## 2022-03-25 HISTORY — PX: COLONOSCOPY WITH PROPOFOL: SHX5780

## 2022-03-25 HISTORY — DX: Gastro-esophageal reflux disease without esophagitis: K21.9

## 2022-03-25 SURGERY — COLONOSCOPY WITH PROPOFOL
Anesthesia: General

## 2022-03-25 MED ORDER — LIDOCAINE HCL (CARDIAC) PF 100 MG/5ML IV SOSY
PREFILLED_SYRINGE | INTRAVENOUS | Status: DC | PRN
  Administered 2022-03-25: 40 mg via INTRAVENOUS

## 2022-03-25 MED ORDER — PROPOFOL 10 MG/ML IV BOLUS
INTRAVENOUS | Status: DC | PRN
  Administered 2022-03-25: 30 mg via INTRAVENOUS
  Administered 2022-03-25: 70 mg via INTRAVENOUS

## 2022-03-25 MED ORDER — PROPOFOL 500 MG/50ML IV EMUL
INTRAVENOUS | Status: DC | PRN
Start: 2022-03-25 — End: 2022-03-25
  Administered 2022-03-25: 125 ug/kg/min via INTRAVENOUS

## 2022-03-25 MED ORDER — PROPOFOL 500 MG/50ML IV EMUL
INTRAVENOUS | Status: AC
  Filled 2022-03-25: qty 50

## 2022-03-25 MED ORDER — SODIUM CHLORIDE 0.9 % IV SOLN
INTRAVENOUS | Status: DC
  Administered 2022-03-25: 20 mL/h via INTRAVENOUS

## 2022-03-25 NOTE — Op Note (Signed)
[facility] Gastroenterology Patient Name: Arthur Davis Procedure Date: 03/25/2022 10:17 AM MRN: [id number] Account #: [id number] Date of Birth: May 20, 1956 Admit Type: Outpatient Age: 66 Room: [facility] ENDO ROOM 3 Gender: Male Note Status: Finalized Instrument Name: Upper Endoscope [id number] Procedure:             Upper GI endoscopy Indications:           Dyspepsia Providers:             Andrey Farmer MD, MD Referring MD:          Kirstie Peri. Caryn Section, MD (Referring MD) Medicines:             Monitored Anesthesia Care Complications:         No immediate complications. Procedure:             Pre-Anesthesia Assessment:                        - Prior to the procedure, a History and Physical was                         performed, and patient medications and allergies were                         reviewed. The patient is competent. The risks and                         benefits of the procedure and the sedation options and                         risks were discussed with the patient. All questions                         were answered and informed consent was obtained.                         Patient identification and proposed procedure were                         verified by the physician, the nurse, the                         anesthesiologist, the anesthetist and the technician                         in the endoscopy suite. Mental Status Examination:                         alert and oriented. Airway Examination: normal                         oropharyngeal airway and neck mobility. Respiratory                         Examination: clear to auscultation. CV Examination:                         normal. Prophylactic Antibiotics: The patient does not  require prophylactic antibiotics. Prior                         Anticoagulants: The patient has taken no previous                         anticoagulant or antiplatelet agents. ASA Grade                          Assessment: II - A patient with mild systemic disease.                         After reviewing the risks and benefits, the patient                         was deemed in satisfactory condition to undergo the                         procedure. The anesthesia plan was to use monitored                         anesthesia care (MAC). Immediately prior to                         administration of medications, the patient was                         re-assessed for adequacy to receive sedatives. The                         heart rate, respiratory rate, oxygen saturations,                         blood pressure, adequacy of pulmonary ventilation, and                         response to care were monitored throughout the                         procedure. The physical status of the patient was                         re-assessed after the procedure.                        After obtaining informed consent, the endoscope was                         passed under direct vision. Throughout the procedure,                         the patient's blood pressure, pulse, and oxygen                         saturations were monitored continuously. The Endoscope                         was introduced through the mouth, and advanced to the  second part of duodenum. The upper GI endoscopy was                         accomplished without difficulty. The patient tolerated                         the procedure well. Findings:      A hypertonic lower esophageal sphincter was found.      The exam of the esophagus was otherwise normal.      The entire examined stomach was normal.      The examined duodenum was normal. Impression:            - Hypertonic lower esophageal sphincter. Given no                         complaints of dysphagia, no empiric dilation was                         performed. If develops complaint of dysphagia then                         would recommend barium swallow  study.                        - Normal stomach.                        - Normal examined duodenum.                        - No specimens collected. Recommendation:        - Perform a colonoscopy today. Procedure Code(s):     --- Professional ---                        [phone], Esophagogastroduodenoscopy, flexible,                         transoral; diagnostic, including collection of                         specimen(s) by brushing or washing, when performed                         (separate procedure) Diagnosis Code(s):     --- Professional ---                        K22.8, Other specified diseases of esophagus                        R10.13, Epigastric pain CPT copyright 2019 American Medical Association. All rights reserved. The codes documented in this report are preliminary and upon coder review may  be revised to meet current compliance requirements. Andrey Farmer MD, MD 03/25/2022 10:48:27 AM Number of Addenda: 0 Note Initiated On: 03/25/2022 10:17 AM Estimated Blood Loss:  Estimated blood loss: none.      [facility]

## 2022-03-25 NOTE — Op Note (Signed)
[facility] Gastroenterology Patient Name: Arthur Davis Procedure Date: 03/25/2022 10:16 AM MRN: [id number] Account #: [id number] Date of Birth: 1956-01-14 Admit Type: Outpatient Age: 66 Room: [facility] ENDO ROOM 3 Gender: Male Note Status: Finalized Instrument Name: Park Meo [id number] Procedure:             Colonoscopy Indications:           Surveillance: Personal history of adenomatous polyps                         on last colonoscopy 5 years ago Providers:             Andrey Farmer MD, MD Referring MD:          Kirstie Peri. Caryn Section, MD (Referring MD) Medicines:             Monitored Anesthesia Care Complications:         No immediate complications. Estimated blood loss:                         Minimal. Procedure:             Pre-Anesthesia Assessment:                        - Prior to the procedure, a History and Physical was                         performed, and patient medications and allergies were                         reviewed. The patient is competent. The risks and                         benefits of the procedure and the sedation options and                         risks were discussed with the patient. All questions                         were answered and informed consent was obtained.                         Patient identification and proposed procedure were                         verified by the physician, the nurse, the                         anesthesiologist, the anesthetist and the technician                         in the endoscopy suite. Mental Status Examination:                         alert and oriented. Airway Examination: normal                         oropharyngeal airway and neck mobility. Respiratory  Examination: clear to auscultation. CV Examination:                         normal. Prophylactic Antibiotics: The patient does not                         require prophylactic antibiotics. Prior                          Anticoagulants: The patient has taken no previous                         anticoagulant or antiplatelet agents. ASA Grade                         Assessment: II - A patient with mild systemic disease.                         After reviewing the risks and benefits, the patient                         was deemed in satisfactory condition to undergo the                         procedure. The anesthesia plan was to use monitored                         anesthesia care (MAC). Immediately prior to                         administration of medications, the patient was                         re-assessed for adequacy to receive sedatives. The                         heart rate, respiratory rate, oxygen saturations,                         blood pressure, adequacy of pulmonary ventilation, and                         response to care were monitored throughout the                         procedure. The physical status of the patient was                         re-assessed after the procedure.                        After obtaining informed consent, the colonoscope was                         passed under direct vision. Throughout the procedure,                         the patient's blood pressure, pulse, and oxygen  saturations were monitored continuously. The                         Colonoscope was introduced through the anus and                         advanced to the the terminal ileum. The colonoscopy                         was performed without difficulty. The patient                         tolerated the procedure well. The quality of the bowel                         preparation was good. Findings:      The perianal and digital rectal examinations were normal.      The terminal ileum appeared normal.      Two sessile polyps were found in the hepatic flexure. The polyps were 1       to 2 mm in size. These polyps were removed with a jumbo cold forceps.        Resection and retrieval were complete. Estimated blood loss was minimal.      A 2 mm polyp was found in the descending colon. The polyp was sessile.       The polyp was removed with a jumbo cold forceps. Resection and retrieval       were complete. Estimated blood loss was minimal.      Internal hemorrhoids were found during retroflexion. The hemorrhoids       were Grade I (internal hemorrhoids that do not prolapse).      The exam was otherwise without abnormality on direct and retroflexion       views. Impression:            - The examined portion of the ileum was normal.                        - Two 1 to 2 mm polyps at the hepatic flexure, removed                         with a jumbo cold forceps. Resected and retrieved.                        - One 2 mm polyp in the descending colon, removed with                         a jumbo cold forceps. Resected and retrieved.                        - Internal hemorrhoids.                        - The examination was otherwise normal on direct and                         retroflexion views. Recommendation:        - Discharge patient to home.                        -  Resume previous diet.                        - Continue present medications.                        - Await pathology results.                        - Repeat colonoscopy for surveillance based on                         pathology results.                        - Return to referring physician as previously                         scheduled. Procedure Code(s):     --- Professional ---                        [phone], Colonoscopy, flexible; with biopsy, single or                         multiple Diagnosis Code(s):     --- Professional ---                        K63.5, Polyp of colon                        Z86.010, Personal history of colonic polyps                        K64.0, First degree hemorrhoids CPT copyright 2019 American Medical Association. All rights reserved. The codes  documented in this report are preliminary and upon coder review may  be revised to meet current compliance requirements. Andrey Farmer MD, MD 03/25/2022 10:51:55 AM Number of Addenda: 0 Note Initiated On: 03/25/2022 10:16 AM Scope Withdrawal Time: 0 hours 9 minutes 44 seconds  Total Procedure Duration: 0 hours 13 minutes 35 seconds  Estimated Blood Loss:  Estimated blood loss was minimal.      [facility]

## 2022-03-25 NOTE — Transfer of Care (Signed)
Immediate Anesthesia Transfer of Care Note  Patient: Arthur Davis  Procedure(s) Performed: COLONOSCOPY WITH PROPOFOL ESOPHAGOGASTRODUODENOSCOPY (EGD) WITH PROPOFOL  Patient Location: PACU  Anesthesia Type:General  Level of Consciousness: awake, alert  and oriented  Airway & Oxygen Therapy: Patient Spontanous Breathing  Post-op Assessment: Report given to RN and Post -op Vital signs reviewed and stable  Post vital signs: Reviewed and stable  Last Vitals:  Vitals Value Taken Time  BP 120/72 03/25/22 1047  Temp 36.4 C 03/25/22 1047  Pulse 50 03/25/22 1051  Resp 13 03/25/22 1051  SpO2 99 % 03/25/22 1051  Vitals shown include unvalidated device data.  Last Pain:  Vitals:   03/25/22 1047  TempSrc: Temporal  PainSc: Asleep         Complications: No notable events documented.

## 2022-03-25 NOTE — Anesthesia Preprocedure Evaluation (Addendum)
Anesthesia Evaluation  Patient identified by MRN, date of birth, ID band Patient awake    Reviewed: Allergy & Precautions, NPO status , Patient's Chart, lab work & pertinent test results  History of Anesthesia Complications Negative for: history of anesthetic complications  Airway Mallampati: I   Neck ROM: Full    Dental  (+) Partial Lower, Caps   Pulmonary sleep apnea and Continuous Positive Airway Pressure Ventilation ,    Pulmonary exam normal breath sounds clear to auscultation       Cardiovascular hypertension, Normal cardiovascular exam Rhythm:Regular Rate:Normal  Myocardial perfusion 08/27/21: Normal myocardial perfusion scan no evidence of stress-induced  myocardial ischemia ejection fraction of 57% conclusion negative scan.   Echo 08/27/21:  NORMAL LEFT VENTRICULAR SYSTOLIC FUNCTION  NORMAL RIGHT VENTRICULAR SYSTOLIC FUNCTION  MILD VALVULAR REGURGITATION  NO VALVULAR STENOSIS  ESTIMATED LVEF >55%  Aortic: NORMAL GRADIENTS  AOV: MILDLY THICKENED LEAFLETS; SCLEROSIS WITH NO EVIDENCE OF STENOSIS  Mitral: MILD MR  Tricuspid: MILD TR (2.60ms)  Pulmonic: TRIVIAL PI    Neuro/Psych  Headaches,    GI/Hepatic GERD  ,  Endo/Other  Prediabetes, obesity  Renal/GU negative Renal ROS     Musculoskeletal   Abdominal   Peds  Hematology negative hematology ROS (+)   Anesthesia Other Findings Cardiology note 09/03/21:  Plan  1 obstructive sleep apnea recommend sleep study CPAP weight loss 2 hypertension reasonably controlled continue irbesartan HCTZ amlodipine 3 diabetes type 2 stable continue metformin  4 obesity recommend weight loss exercise portion control 5 GERD recommend omeprazole for reflux type symptoms 6 chest pain intermittent recurrent low continue current medical therapy ibuprofen blood pressure control have the patient follow-up with [place] symptoms persist 7 have the patient follow-up as necessary  recently had negative echo negative Myoview 8 follow-up as needed  Return if symptoms worsen or fail to improve.   Reproductive/Obstetrics                            Anesthesia Physical Anesthesia Plan  ASA: 2  Anesthesia Plan: General   Post-op Pain Management:    Induction: Intravenous  PONV Risk Score and Plan: 2 and Propofol infusion, TIVA and Treatment may vary due to age or medical condition  Airway Management Planned: Natural Airway  Additional Equipment:   Intra-op Plan:   Post-operative Plan:   Informed Consent: I have reviewed the patients History and Physical, chart, labs and discussed the procedure including the risks, benefits and alternatives for the proposed anesthesia with the patient or authorized representative who has indicated his/her understanding and acceptance.       Plan Discussed with: CRNA  Anesthesia Plan Comments: (LMA/GETA backup discussed.  Patient consented for risks of anesthesia including but not limited to:  - adverse reactions to medications - damage to eyes, teeth, lips or other oral mucosa - nerve damage due to positioning  - sore throat or hoarseness - damage to heart, brain, nerves, lungs, other parts of body or loss of life  Informed patient about role of CRNA in peri- and intra-operative care.  Patient voiced understanding.)        Anesthesia Quick Evaluation

## 2022-03-25 NOTE — Anesthesia Postprocedure Evaluation (Signed)
Anesthesia Post Note  Patient: JORIEL STREETY  Procedure(s) Performed: COLONOSCOPY WITH PROPOFOL ESOPHAGOGASTRODUODENOSCOPY (EGD) WITH PROPOFOL  Patient location during evaluation: PACU Anesthesia Type: General Level of consciousness: awake and alert, oriented and patient cooperative Pain management: pain level controlled Vital Signs Assessment: post-procedure vital signs reviewed and stable Respiratory status: spontaneous breathing, nonlabored ventilation and respiratory function stable Cardiovascular status: blood pressure returned to baseline and stable Postop Assessment: adequate PO intake Anesthetic complications: no   No notable events documented.   Last Vitals:  Vitals:   03/25/22 1057 03/25/22 1107  BP: 126/71 125/78  Pulse: (!) 48 (!) 50  Resp: 13 14  Temp:    SpO2: 99% 100%    Last Pain:  Vitals:   03/25/22 1107  TempSrc:   PainSc: 0-No pain                 Darrin Nipper

## 2022-03-25 NOTE — Interval H&P Note (Signed)
History and Physical Interval Note:  03/25/2022 10:15 AM  Arthur Davis  has presented today for surgery, with the diagnosis of R10.84 - Generalized abdominal pain Z86.010  - Hx of adenomatous colonic polyps K21.9  - Gastroesophageal reflux disease, unspecified whether esophagitis present.  The various methods of treatment have been discussed with the patient and family. After consideration of risks, benefits and other options for treatment, the patient has consented to  Procedure(s): COLONOSCOPY WITH PROPOFOL (N/A) ESOPHAGOGASTRODUODENOSCOPY (EGD) WITH PROPOFOL (N/A) as a surgical intervention.  The patient's history has been reviewed, patient examined, no change in status, stable for surgery.  I have reviewed the patient's chart and labs.  Questions were answered to the patient's satisfaction.     Lesly Rubenstein  Ok to proceed with EGD/Colonoscopy

## 2022-03-25 NOTE — H&P (Signed)
Outpatient short stay form Pre-procedure 03/25/2022  Lesly Rubenstein, MD  Primary Physician: Birdie Sons, MD  Reason for visit:  Dyspepsia/Surveillance colonoscopy  History of present illness:    66 y/o gentleman with history of hypertension, GERD, and DM II here for EGD/Colonoscopy for dyspepsia, history of food impactions, and history of colon polyps. No blood thinners. No family history of GI malignancies. History of appendectomy.    Current Facility-Administered Medications:    0.9 %  sodium chloride infusion, , Intravenous, Continuous, Nyanna Heideman, Hilton Cork, MD, Last Rate: 20 mL/hr at 03/25/22 1008, 20 mL/hr at 03/25/22 1008  Medications Prior to Admission  Medication Sig Dispense Refill Last Dose   amLODipine (NORVASC) 2.5 MG tablet TAKE ONE (1) TABLET BY MOUTH ONCE DAILY 90 tablet 2 Past Week   Cholecalciferol (VITAMIN D3) 2000 UNITS TABS Take 1 tablet by mouth daily. Takes 1 tablet 2 or 3 times per week.   Past Week   Flaxseed, Linseed, (RA FLAX SEED OIL 1000 PO) Take 1 capsule by mouth daily.   Past Week   irbesartan-hydrochlorothiazide (AVALIDE) 150-12.5 MG tablet TAKE ONE (1) TABLET BY MOUTH ONCE DAILY 90 tablet 4 Past Week   Magnesium Oxide 500 MG (LAX) TABS Take Over The Counter Magnesium Oxide 400 to 600 mg per day (if develop diarrhea - try magnesium glycinate 400 to 600 mg per day)   Past Week   metFORMIN (GLUCOPHAGE-XR) 500 MG 24 hr tablet TAKE ONE (1) TABLET BY MOUTH ONCE DAILY 90 tablet 2 Past Week   naproxen (NAPROSYN) 500 MG tablet    Past Week   Omega-3 Fatty Acids (FISH OIL CONCENTRATE) 1000 MG CAPS Take 1 capsule by mouth daily.    Past Week   omeprazole (PRILOSEC) 40 MG capsule Take 1 capsule (40 mg total) by mouth daily. 90 capsule 1 Past Week   polyethylene glycol-electrolytes (NULYTELY) 420 g solution Take 4,000 mLs by mouth as directed.   03/24/2022   topiramate (TOPAMAX) 50 MG tablet TAKE (1) TABLET BY MOUTH EVERY DAY 90 tablet 4 03/24/2022   zolpidem  (AMBIEN) 10 MG tablet Take 1 tablet (10 mg total) by mouth at bedtime. 1/2-1 tablet oral at bedtime. 30 tablet 1 Past Week     Allergies  Allergen Reactions   Nisoldipine Other (See Comments)   Sulfa Antibiotics Other (See Comments)    Cause confusion     Past Medical History:  Diagnosis Date   GERD (gastroesophageal reflux disease)    Headache    Migraines   History of colonic polyps 05/09/2006   Hypertension 03/20/2000   Pre-diabetes    Sleep apnea     Review of systems:  Otherwise negative.    Physical Exam  Gen: Alert, oriented. Appears stated age.  HEENT: PERRLA. Lungs: No respiratory distress CV: RRR Abd: soft, benign, no masses Ext: No edema    Planned procedures: Proceed with EGD/colonoscopy. The patient understands the nature of the planned procedure, indications, risks, alternatives and potential complications including but not limited to bleeding, infection, perforation, damage to internal organs and possible oversedation/side effects from anesthesia. The patient agrees and gives consent to proceed.  Please refer to procedure notes for findings, recommendations and patient disposition/instructions.     Lesly Rubenstein, MD [facility] Gastroenterology

## 2022-03-29 ENCOUNTER — Encounter: Payer: Self-pay | Admitting: Gastroenterology

## 2022-03-29 LAB — SURGICAL PATHOLOGY

## 2022-04-05 ENCOUNTER — Other Ambulatory Visit: Payer: Self-pay | Admitting: Nurse Practitioner

## 2022-04-05 DIAGNOSIS — K219 Gastro-esophageal reflux disease without esophagitis: Secondary | ICD-10-CM

## 2022-04-05 DIAGNOSIS — R1310 Dysphagia, unspecified: Secondary | ICD-10-CM

## 2022-04-06 ENCOUNTER — Ambulatory Visit: Payer: Medicare Other | Attending: Otolaryngology

## 2022-04-06 DIAGNOSIS — G4733 Obstructive sleep apnea (adult) (pediatric): Secondary | ICD-10-CM | POA: Insufficient documentation

## 2022-04-06 DIAGNOSIS — Z9989 Dependence on other enabling machines and devices: Secondary | ICD-10-CM | POA: Insufficient documentation

## 2022-04-06 DIAGNOSIS — G4710 Hypersomnia, unspecified: Secondary | ICD-10-CM | POA: Diagnosis not present

## 2022-04-13 ENCOUNTER — Telehealth: Payer: Self-pay | Admitting: Family Medicine

## 2022-04-13 NOTE — Telephone Encounter (Signed)
Patient advised of results. He does not have a preference for a DME company. He is fine with whoever Dr. Caryn Section recommends. His old machine was purchased several years ago through a company called [employer]. He has no contact information for this company.

## 2022-04-13 NOTE — Telephone Encounter (Signed)
Sleep study confirms moderate sleep apnea. He needs new CPAP, does he know which DME company he wants to use for CPAP machine.

## 2022-04-14 ENCOUNTER — Other Ambulatory Visit: Payer: Self-pay | Admitting: Nurse Practitioner

## 2022-04-14 DIAGNOSIS — K760 Fatty (change of) liver, not elsewhere classified: Secondary | ICD-10-CM

## 2022-05-02 ENCOUNTER — Ambulatory Visit
Admission: RE | Admit: 2022-05-02 | Discharge: 2022-05-02 | Disposition: A | Discharge status: Home / Self Care | Payer: Medicare Other | Source: Ambulatory Visit | Attending: Nurse Practitioner | Admitting: Nurse Practitioner

## 2022-05-02 DIAGNOSIS — K219 Gastro-esophageal reflux disease without esophagitis: Secondary | ICD-10-CM

## 2022-05-02 DIAGNOSIS — K760 Fatty (change of) liver, not elsewhere classified: Secondary | ICD-10-CM

## 2022-05-02 DIAGNOSIS — R1310 Dysphagia, unspecified: Secondary | ICD-10-CM | POA: Diagnosis present

## 2022-06-06 ENCOUNTER — Other Ambulatory Visit: Payer: Self-pay | Admitting: Family Medicine

## 2022-06-06 DIAGNOSIS — I10 Essential (primary) hypertension: Secondary | ICD-10-CM

## 2022-09-07 ENCOUNTER — Ambulatory Visit: Payer: Medicare Other | Admitting: Family Medicine

## 2022-09-10 ENCOUNTER — Other Ambulatory Visit: Payer: Self-pay | Admitting: Family Medicine

## 2022-09-10 DIAGNOSIS — G43809 Other migraine, not intractable, without status migrainosus: Secondary | ICD-10-CM

## 2022-09-10 DIAGNOSIS — R7303 Prediabetes: Secondary | ICD-10-CM

## 2022-09-15 NOTE — Progress Notes (Signed)
I,Roshena L Chambers,acting as a scribe for Lelon Huh, MD.,have documented all relevant documentation on the behalf of Lelon Huh, MD,as directed by  Lelon Huh, MD while in the presence of Lelon Huh, MD.    Established patient visit   Patient: Arthur Davis   DOB: 1956-06-07   66 y.o. Male  MRN: [id number] Visit Date: 09/16/2022  Today's healthcare provider: Lelon Huh, MD   Chief Complaint  Patient presents with   Prediabetes   Elevated Vitamin D level   Subjective    HPI  Prediabetes, Follow-up  Lab Results  Component Value Date   HGBA1C 5.3 03/04/2022   HGBA1C 5.5 11/23/2021   HGBA1C 5.4 01/19/2021   GLUCOSE 110 (H) 03/04/2022   GLUCOSE 115 (H) 11/23/2021   GLUCOSE 138 (H) 07/20/2021    Last seen for for this 6 months ago.  Management since that visit includes continuing same medication and lifestyle modifications. Current symptoms include none and have been stable.  Prior visit with dietician: no Current diet: well balanced Current exercise: none  Pertinent Labs:    Component Value Date/Time   CHOL 143 03/04/2022 1109   TRIG 123 03/04/2022 1109   CHOLHDL 3.5 03/04/2022 1109   CREATININE 1.14 03/04/2022 1109   CREATININE 1.41 (H) 10/25/2012 0424    Wt Readings from Last 3 Encounters:  09/16/22 199 lb (90.3 kg)  03/04/22 196 lb (88.9 kg)  01/27/22 211 lb (95.7 kg)    -----------------------------------------------------------------------------------------   Follow up for elevated Vitamin D level:  The patient was last seen for this 6 months ago. Changes made at last visit include decreasing Vitamin D supplement to 1 tablet once a week.  Lab Results  Component Value Date   VD25OH 118.0 (H) 03/04/2022    Patient stopped taking Vitamin D supplement since last visit.  -----------------------------------------------------------------------------------------   He also states he stopped taking a chocolate supplement which was  apparently upsetting stomach and seems to have made him more forgetful.   He also states he is out of omeprazole for GERD which he had been taking for year, and having more symptoms since running out.   Medications: Outpatient Medications Prior to Visit  Medication Sig   amLODipine (NORVASC) 2.5 MG tablet TAKE ONE (1) TABLET BY MOUTH ONCE DAILY   Flaxseed, Linseed, (RA FLAX SEED OIL 1000 PO) Take 1 capsule by mouth daily.   irbesartan-hydrochlorothiazide (AVALIDE) 150-12.5 MG tablet TAKE ONE (1) TABLET BY MOUTH ONCE DAILY   Magnesium Oxide 500 MG (LAX) TABS Take Over The Counter Magnesium Oxide 400 to 600 mg per day (if develop diarrhea - try magnesium glycinate 400 to 600 mg per day)   metFORMIN (GLUCOPHAGE-XR) 500 MG 24 hr tablet TAKE ONE (1) TABLET BY MOUTH ONCE DAILY   naproxen (NAPROSYN) 500 MG tablet    Omega-3 Fatty Acids (FISH OIL CONCENTRATE) 1000 MG CAPS Take 1 capsule by mouth daily.    omeprazole (PRILOSEC) 40 MG capsule Take 1 capsule (40 mg total) by mouth daily.   topiramate (TOPAMAX) 50 MG tablet TAKE (1) TABLET BY MOUTH EVERY DAY   zolpidem (AMBIEN) 10 MG tablet Take 1 tablet (10 mg total) by mouth at bedtime. 1/2-1 tablet oral at bedtime.   Cholecalciferol (VITAMIN D3) 2000 UNITS TABS Take 1 tablet by mouth daily. Takes 1 tablet 2 or 3 times per week. (Patient not taking: Reported on 09/16/2022)   [DISCONTINUED] polyethylene glycol-electrolytes (NULYTELY) 420 g solution Take 4,000 mLs by mouth as directed.  No facility-administered medications prior to visit.    Review of Systems  Constitutional:  Negative for appetite change, chills and fever.  Respiratory:  Negative for chest tightness, shortness of breath and wheezing.   Cardiovascular:  Negative for chest pain and palpitations.  Gastrointestinal:  Negative for abdominal pain, nausea and vomiting.       Objective    BP 120/60 (BP Location: Left Arm, Patient Position: Sitting, Cuff Size: Large)   Pulse 65    Temp 98.5 F (36.9 C) (Oral)   Resp 14   Wt 199 lb (90.3 kg)   SpO2 99% Comment: room air  BMI 28.55 kg/m    Physical Exam  General appearance: Well developed, well nourished male, cooperative and in no acute distress Head: Normocephalic, without obvious abnormality, atraumatic Respiratory: Respirations even and unlabored, normal respiratory rate Extremities: All extremities are intact.  Skin: Skin color, texture, turgor normal. No rashes seen  Psych: Appropriate mood and affect. Neurologic: Mental status: Alert, oriented to person, place, and time, thought content appropriate.   Assessment & Plan     1. Vitamin D deficiency Is now off of supplements due to excessive vitamin D 25-oh levels when last checked in May.  - VITAMIN D 25 Hydroxy (Vit-D Deficiency, Fractures)  2. Essential (primary) hypertension Well controlled.  Continue current medications.    3. Prediabetes Doing well on metformin.  - Hemoglobin A1c  4. Chronic midline low back pain without sciatica Is no longer taking Naprosyn.   Start  meloxicam (MOBIC) 7.5 MG tablet; Take 1 tablet (7.5 mg total) by mouth daily.  Dispense: 30 tablet; Refill: 0 - Ambulatory referral to Physical Therapy  5. Bad memory Still has a little trouble, but improved since he stopped taking some type chocolate supplement.   6. Gastroesophageal reflux disease, unspecified whether esophagitis present Worse since running out of omeprazole. refill omeprazole (PRILOSEC) 40 MG capsule; Take 1 capsule (40 mg total) by mouth daily.  Dispense: 90 capsule; Refill: 1  But advised he could just take intermittently as needed to reduced risk of long term side effects.   7. Need for influenza vaccination  - Flu Vaccine QUAD High Dose IM (Fluad)      The entirety of the information documented in the History of Present Illness, Review of Systems and Physical Exam were personally obtained by me. Portions of this information were initially  documented by the CMA and reviewed by me for thoroughness and accuracy.     Lelon Huh, MD  [facility] [phone] (phone) [phone] (fax)  [place]

## 2022-09-16 ENCOUNTER — Ambulatory Visit (INDEPENDENT_AMBULATORY_CARE_PROVIDER_SITE_OTHER): Payer: Medicare Other | Admitting: Family Medicine

## 2022-09-16 ENCOUNTER — Encounter: Payer: Self-pay | Admitting: Family Medicine

## 2022-09-16 VITALS — BP 120/60 | HR 65 | Temp 98.5°F | Resp 14 | Wt 199.0 lb

## 2022-09-16 DIAGNOSIS — I10 Essential (primary) hypertension: Secondary | ICD-10-CM

## 2022-09-16 DIAGNOSIS — R413 Other amnesia: Secondary | ICD-10-CM

## 2022-09-16 DIAGNOSIS — E559 Vitamin D deficiency, unspecified: Secondary | ICD-10-CM | POA: Diagnosis not present

## 2022-09-16 DIAGNOSIS — R7303 Prediabetes: Secondary | ICD-10-CM | POA: Diagnosis not present

## 2022-09-16 DIAGNOSIS — M545 Low back pain, unspecified: Secondary | ICD-10-CM | POA: Diagnosis not present

## 2022-09-16 DIAGNOSIS — Z23 Encounter for immunization: Secondary | ICD-10-CM | POA: Diagnosis not present

## 2022-09-16 DIAGNOSIS — G8929 Other chronic pain: Secondary | ICD-10-CM

## 2022-09-16 DIAGNOSIS — K219 Gastro-esophageal reflux disease without esophagitis: Secondary | ICD-10-CM

## 2022-09-16 MED ORDER — OMEPRAZOLE 40 MG PO CPDR: 40.0000 mg | DELAYED_RELEASE_CAPSULE | Freq: Every day | ORAL | 1 refills | Status: DC

## 2022-09-16 MED ORDER — MELOXICAM 7.5 MG PO TABS: 7.5000 mg | ORAL_TABLET | Freq: Every day | ORAL | 0 refills | Status: AC

## 2022-09-17 LAB — VITAMIN D 25 HYDROXY (VIT D DEFICIENCY, FRACTURES): Vit D, 25-Hydroxy: 56.1 ng/mL (ref 30.0–100.0)

## 2022-09-17 LAB — HEMOGLOBIN A1C
Est. average glucose Bld gHb Est-mCnc: 108 mg/dL
Hgb A1c MFr Bld: 5.4 % (ref 4.8–5.6)

## 2022-09-18 ENCOUNTER — Other Ambulatory Visit: Payer: Self-pay | Admitting: Family Medicine

## 2022-12-08 ENCOUNTER — Telehealth: Payer: Self-pay

## 2022-12-08 NOTE — Telephone Encounter (Signed)
Copied from [place] [id number]. Topic: General - Other >> Dec 08, 2022  9:46 AM Cyndi Bender wrote: Reason for CRM: Pt requests return call to discuss sleep study results and the referral because he has not heard from anyone.

## 2022-12-09 ENCOUNTER — Other Ambulatory Visit: Payer: Self-pay | Admitting: Family Medicine

## 2022-12-09 DIAGNOSIS — I10 Essential (primary) hypertension: Secondary | ICD-10-CM

## 2022-12-09 DIAGNOSIS — F5101 Primary insomnia: Secondary | ICD-10-CM

## 2022-12-09 NOTE — Telephone Encounter (Signed)
Requested Prescriptions  Pending Prescriptions Disp Refills   irbesartan-hydrochlorothiazide (AVALIDE) 150-12.5 MG tablet [Pharmacy Med Name: IRBESARTAN-HCTZ 150-12.5 MG TAB] 90 tablet 4    Sig: TAKE (1) TABLET BY MOUTH EVERY DAY     Cardiovascular: ARB + Diuretic Combos Failed - 12/09/2022 12:45 PM      Failed - K in normal range and within 180 days    Potassium  Date Value Ref Range Status  03/04/2022 4.1 3.5 - 5.2 mmol/L Final  10/25/2012 3.3 (L) 3.5 - 5.1 mmol/L Final         Failed - Na in normal range and within 180 days    Sodium  Date Value Ref Range Status  03/04/2022 142 134 - 144 mmol/L Final  10/25/2012 142 136 - 145 mmol/L Final         Failed - Cr in normal range and within 180 days    Creatinine  Date Value Ref Range Status  10/25/2012 1.41 (H) 0.60 - 1.30 mg/dL Final   Creatinine, Ser  Date Value Ref Range Status  03/04/2022 1.14 0.76 - 1.27 mg/dL Final         Failed - eGFR is 10 or above and within 180 days    EGFR (African American)  Date Value Ref Range Status  10/25/2012 >60  Final   GFR calc Af Amer  Date Value Ref Range Status  02/21/2020 86 >59 mL/min/1.73 Final    Comment:    **[facility] currently reports eGFR in compliance with the current**   recommendations of the [employer]. [facility] will   update reporting as new guidelines are published from the NKF-ASN   Task force.    EGFR (Non-African Amer.)  Date Value Ref Range Status  10/25/2012 55 (L)  Final    Comment:    eGFR values <66m/min/1.73 m2 may be an indication of chronic kidney disease (CKD). Calculated eGFR is useful in patients with stable renal function. The eGFR calculation will not be reliable in acutely ill patients when serum creatinine is changing rapidly. It is not useful in  patients on dialysis. The eGFR calculation may not be applicable to patients at the low and high extremes of body sizes, pregnant women, and vegetarians.    GFR, Estimated  Date  Value Ref Range Status  07/20/2021 >60 >60 mL/min Final    Comment:    (NOTE) Calculated using the CKD-EPI Creatinine Equation (2021)    eGFR  Date Value Ref Range Status  03/04/2022 71 >59 mL/min/1.73 Final         Passed - Patient is not pregnant      Passed - Last BP in normal range    BP Readings from Last 1 Encounters:  09/16/22 120/60         Passed - Valid encounter within last 6 months    Recent Outpatient Visits           [facility]   9 months ago Essential (primary) hypertension   [facility] [place]   11 months ago Anterolisthesis of lumbar spine   [facility] [place]   1 year ago [facility] upper [facility]   1 year ago [place] palpitations   [place]

## 2022-12-09 NOTE — Telephone Encounter (Signed)
Pt is returning a phone call for Arthur Davis. Please call pt back.

## 2022-12-09 NOTE — Telephone Encounter (Signed)
Unable to refill per protocol, Rx request is too soon. Last refill 12/06/21 for 90 and 4 refills.  Requested Prescriptions  Pending Prescriptions Disp Refills   irbesartan-hydrochlorothiazide (AVALIDE) 150-12.5 MG tablet [Pharmacy Med Name: IRBESARTAN-HCTZ 150-12.5 MG TAB] 90 tablet 4    Sig: TAKE (1) TABLET BY MOUTH EVERY DAY     Cardiovascular: ARB + Diuretic Combos Failed - 12/09/2022 11:37 AM      Failed - K in normal range and within 180 days    Potassium  Date Value Ref Range Status  03/04/2022 4.1 3.5 - 5.2 mmol/L Final  10/25/2012 3.3 (L) 3.5 - 5.1 mmol/L Final         Failed - Na in normal range and within 180 days    Sodium  Date Value Ref Range Status  03/04/2022 142 134 - 144 mmol/L Final  10/25/2012 142 136 - 145 mmol/L Final         Failed - Cr in normal range and within 180 days    Creatinine  Date Value Ref Range Status  10/25/2012 1.41 (H) 0.60 - 1.30 mg/dL Final   Creatinine, Ser  Date Value Ref Range Status  03/04/2022 1.14 0.76 - 1.27 mg/dL Final         Failed - eGFR is 10 or above and within 180 days    EGFR (African American)  Date Value Ref Range Status  10/25/2012 >60  Final   GFR calc Af Amer  Date Value Ref Range Status  02/21/2020 86 >59 mL/min/1.73 Final    Comment:    **[facility] currently reports eGFR in compliance with the current**   recommendations of the [employer]. [facility] will   update reporting as new guidelines are published from the NKF-ASN   Task force.    EGFR (Non-African Amer.)  Date Value Ref Range Status  10/25/2012 55 (L)  Final    Comment:    eGFR values <29m/min/1.73 m2 may be an indication of chronic kidney disease (CKD). Calculated eGFR is useful in patients with stable renal function. The eGFR calculation will not be reliable in acutely ill patients when serum creatinine is changing rapidly. It is not useful in  patients on dialysis. The eGFR calculation may not be applicable to patients at the  low and high extremes of body sizes, pregnant women, and vegetarians.    GFR, Estimated  Date Value Ref Range Status  07/20/2021 >60 >60 mL/min Final    Comment:    (NOTE) Calculated using the CKD-EPI Creatinine Equation (2021)    eGFR  Date Value Ref Range Status  03/04/2022 71 >59 mL/min/1.73 Final         Passed - Patient is not pregnant      Passed - Last BP in normal range    BP Readings from Last 1 Encounters:  09/16/22 120/60         Passed - Valid encounter within last 6 months    Recent Outpatient Visits           2 months ago Vitamin D deficiency   [facility] [place]   9 months ago Essential (primary) hypertension   [place] [place]   11 months ago Anterolisthesis of lumbar spine   [place] [place]   1 year ago [facility] of upper abdomen   [facility]   1 year  ago Heart palpitations   [place], Donald E, MD               zolpidem (AMBIEN) 10 MG tablet [Pharmacy Med Name: ZOLPIDEM TARTRATE 10 MG TAB] 30 tablet     Sig: TAKE 0.5 TO 1 TABLET BY MOUTH DAILY AT BEDTIME     Not Delegated - Psychiatry:  Anxiolytics/Hypnotics Failed - 12/09/2022 11:37 AM      Failed - This refill cannot be delegated      Failed - Urine Drug Screen completed in last 360 days      Passed - Valid encounter within last 6 months    Recent Outpatient Visits           2 months ago Vitamin D deficiency   [place] Birdie Sons, MD   9 months ago Essential (primary) hypertension   [place] Birdie Sons, MD   11 months ago Anterolisthesis of lumbar spine   [place] Birdie Sons, MD   1 year ago Pain of upper abdomen   [facility] Birdie [facility], MD    1 year ago Heart palpitations   [place], Donald E, MD

## 2022-12-09 NOTE — Telephone Encounter (Signed)
Called pharmacy. Rx is expired - only valid for 1 year at pharmacy.

## 2023-02-01 ENCOUNTER — Ambulatory Visit (INDEPENDENT_AMBULATORY_CARE_PROVIDER_SITE_OTHER): Payer: Medicare Other

## 2023-02-01 VITALS — Ht 70.0 in | Wt 201.0 lb

## 2023-02-01 DIAGNOSIS — Z0000 Encounter for general adult medical examination without abnormal findings: Secondary | ICD-10-CM | POA: Diagnosis not present

## 2023-02-01 NOTE — Patient Instructions (Signed)
Arthur Davis , Thank you for taking time to come for your Medicare Wellness Visit. I appreciate your ongoing commitment to your health goals. Please review the following plan we discussed and let me know if I can assist you in the future.   These are the goals we discussed:  Goals      DIET - EAT MORE FRUITS AND VEGETABLES        This is a list of the screening recommended for you and due dates:  Health Maintenance  Topic Date Due   Zoster (Shingles) Vaccine (1 of 2) Never done   DTaP/Tdap/Td vaccine (2 - Td or Tdap) 05/12/2022   COVID-19 Vaccine (4 - 2023-24 season) 07/01/2022   Flu Shot  06/01/2023   Medicare Annual Wellness Visit  02/01/2024   Colon Cancer Screening  03/25/2029   Pneumonia Vaccine  Completed   Hepatitis C Screening: USPSTF Recommendation to screen - Ages 18-79 yo.  Completed   HPV Vaccine  Aged Out    Advanced directives: yes  Conditions/risks identified: none  Next appointment: Follow up in one year for your annual wellness visit. 02/07/2024 @1pm  telephone  Preventive Care 65 Years and Older, Male  Preventive care refers to lifestyle choices and visits with your health care provider that can promote health and wellness. What does preventive care include? A yearly physical exam. This is also called an annual well check. Dental exams once or twice a year. Routine eye exams. Ask your health care provider how often you should have your eyes checked. Personal lifestyle choices, including: Daily care of your teeth and gums. Regular physical activity. Eating a healthy diet. Avoiding tobacco and drug use. Limiting alcohol use. Practicing safe sex. Taking low doses of aspirin every day. Taking vitamin and mineral supplements as recommended by your health care provider. What happens during an annual well check? The services and screenings done by your health care provider during your annual well check will depend on your age, overall health, lifestyle risk  factors, and family history of disease. Counseling  Your health care provider may ask you questions about your: Alcohol use. Tobacco use. Drug use. Emotional well-being. Home and relationship well-being. Sexual activity. Eating habits. History of falls. Memory and ability to understand (cognition). Work and work Statistician. Screening  You may have the following tests or measurements: Height, weight, and BMI. Blood pressure. Lipid and cholesterol levels. These may be checked every 5 years, or more frequently if you are over 54 years old. Skin check. Lung cancer screening. You may have this screening every year starting at age 46 if you have a 30-pack-year history of smoking and currently smoke or have quit within the past 15 years. Fecal occult blood test (FOBT) of the stool. You may have this test every year starting at age 32. Flexible sigmoidoscopy or colonoscopy. You may have a sigmoidoscopy every 5 years or a colonoscopy every 10 years starting at age 27. Prostate cancer screening. Recommendations will vary depending on your family history and other risks. Hepatitis C blood test. Hepatitis B blood test. Sexually transmitted disease (STD) testing. Diabetes screening. This is done by checking your blood sugar (glucose) after you have not eaten for a while (fasting). You may have this done every 1-3 years. Abdominal aortic aneurysm (AAA) screening. You may need this if you are a current or former smoker. Osteoporosis. You may be screened starting at age 75 if you are at high risk. Talk with your health care provider about your test  results, treatment options, and if necessary, the need for more tests. Vaccines  Your health care provider may recommend certain vaccines, such as: Influenza vaccine. This is recommended every year. Tetanus, diphtheria, and acellular pertussis (Tdap, Td) vaccine. You may need a Td booster every 10 years. Zoster vaccine. You may need this after age  46. Pneumococcal 13-valent conjugate (PCV13) vaccine. One dose is recommended after age 68. Pneumococcal polysaccharide (PPSV23) vaccine. One dose is recommended after age 20. Talk to your health care provider about which screenings and vaccines you need and how often you need them. This information is not intended to replace advice given to you by your health care provider. Make sure you discuss any questions you have with your health care provider. Document Released: 11/13/2015 Document Revised: 07/06/2016 Document Reviewed: 08/18/2015 Elsevier Interactive Patient Education  2017 [place] Prevention in the Home Falls can cause injuries. They can happen to people of all ages. There are many things you can do to make your home safe and to help prevent falls. What can I do on the outside of my home? Regularly fix the edges of walkways and driveways and fix any cracks. Remove anything that might make you trip as you walk through a door, such as a raised step or threshold. Trim any bushes or trees on the path to your home. Use bright outdoor lighting. Clear any walking paths of anything that might make someone trip, such as rocks or tools. Regularly check to see if handrails are loose or broken. Make sure that both sides of any steps have handrails. Any raised decks and porches should have guardrails on the edges. Have any leaves, snow, or ice cleared regularly. Use sand or salt on walking paths during winter. Clean up any spills in your garage right away. This includes oil or grease spills. What can I do in the bathroom? Use night lights. Install grab bars by the toilet and in the tub and shower. Do not use towel bars as grab bars. Use non-skid mats or decals in the tub or shower. If you need to sit down in the shower, use a plastic, non-slip stool. Keep the floor dry. Clean up any water that spills on the floor as soon as it happens. Remove soap buildup in the tub or shower  regularly. Attach bath mats securely with double-sided non-slip rug tape. Do not have throw rugs and other things on the floor that can make you trip. What can I do in the bedroom? Use night lights. Make sure that you have a light by your bed that is easy to reach. Do not use any sheets or blankets that are too big for your bed. They should not hang down onto the floor. Have a firm chair that has side arms. You can use this for support while you get dressed. Do not have throw rugs and other things on the floor that can make you trip. What can I do in the kitchen? Clean up any spills right away. Avoid walking on wet floors. Keep items that you use a lot in easy-to-reach places. If you need to reach something above you, use a strong step stool that has a grab bar. Keep electrical cords out of the way. Do not use floor polish or wax that makes floors slippery. If you must use wax, use non-skid floor wax. Do not have throw rugs and other things on the floor that can make you trip. What can I do with my stairs?  Do not leave any items on the stairs. Make sure that there are handrails on both sides of the stairs and use them. Fix handrails that are broken or loose. Make sure that handrails are as long as the stairways. Check any carpeting to make sure that it is firmly attached to the stairs. Fix any carpet that is loose or worn. Avoid having throw rugs at the top or bottom of the stairs. If you do have throw rugs, attach them to the floor with carpet tape. Make sure that you have a light switch at the top of the stairs and the bottom of the stairs. If you do not have them, ask someone to add them for you. What else can I do to help prevent falls? Wear shoes that: Do not have high heels. Have rubber bottoms. Are comfortable and fit you well. Are closed at the toe. Do not wear sandals. If you use a stepladder: Make sure that it is fully opened. Do not climb a closed stepladder. Make sure that  both sides of the stepladder are locked into place. Ask someone to hold it for you, if possible. Clearly mark and make sure that you can see: Any grab bars or handrails. First and last steps. Where the edge of each step is. Use tools that help you move around (mobility aids) if they are needed. These include: Canes. Walkers. Scooters. Crutches. Turn on the lights when you go into a dark area. Replace any light bulbs as soon as they burn out. Set up your furniture so you have a clear path. Avoid moving your furniture around. If any of your floors are uneven, fix them. If there are any pets around you, be aware of where they are. Review your medicines with your doctor. Some medicines can make you feel dizzy. This can increase your chance of falling. Ask your doctor what other things that you can do to help prevent falls. This information is not intended to replace advice given to you by your health care provider. Make sure you discuss any questions you have with your health care provider. Document Released: 08/13/2009 Document Revised: 03/24/2016 Document Reviewed: 11/21/2014 Elsevier Interactive Patient Education  2017 [employer].

## 2023-02-01 NOTE — Progress Notes (Signed)
I connected with  Arthur Davis on 02/01/23 by a audio enabled telemedicine application and verified that I am speaking with the correct person using two identifiers.  Patient Location: Home  Provider Location: Office/Clinic  I discussed the limitations of evaluation and management by telemedicine. The patient expressed understanding and agreed to proceed.  Subjective:   Arthur Davis is a 67 y.o. male who presents for Medicare Annual/Subsequent preventive examination.  Review of Systems    Cardiac Risk Factors include: advanced age (>6men, >13 women);dyslipidemia;hypertension;male gender    Objective:    Today's Vitals   02/01/23 1334  Weight: 201 lb (91.2 kg)  Height: 5\' 10"  (1.778 m)   Body mass index is 28.84 kg/m.     02/01/2023    1:44 PM 03/25/2022    9:58 AM 01/27/2022    1:08 PM 07/20/2021    3:07 PM 03/06/2021    3:39 PM 10/23/2019   11:12 PM 10/23/2019    8:52 PM  Advanced Directives  Does Patient Have a Medical Advance Directive? Yes  Yes No Yes Yes Yes  Type of Scientist, physiological of [place];Living will  [place];Living will Living will [place];Living will  Does patient want to make changes to medical advance directive?   Yes (Inpatient - patient defers changing a medical advance directive and declines information at this time)      Copy of [place] in Chart?   No - copy requested    No - copy requested  Would patient like information on creating a medical advance directive?       No - Patient declined     Information is confidential and restricted. Go to Review Flowsheets to unlock data.    Current Medications (verified) Outpatient Encounter Medications as of 02/01/2023  Medication Sig   amLODipine (NORVASC) 2.5 MG tablet TAKE ONE (1) TABLET BY MOUTH ONCE DAILY   Flaxseed, Linseed, (RA FLAX SEED OIL 1000 PO) Take 1 capsule by mouth daily.   irbesartan-hydrochlorothiazide (AVALIDE)  150-12.5 MG tablet TAKE (1) TABLET BY MOUTH EVERY DAY   Magnesium Oxide 500 MG (LAX) TABS Take Over The Counter Magnesium Oxide 400 to 600 mg per day (if develop diarrhea - try magnesium glycinate 400 to 600 mg per day)   metFORMIN (GLUCOPHAGE-XR) 500 MG 24 hr tablet TAKE ONE (1) TABLET BY MOUTH ONCE DAILY   Omega-3 Fatty Acids (FISH OIL CONCENTRATE) 1000 MG CAPS Take 1 capsule by mouth daily.    omeprazole (PRILOSEC) 40 MG capsule Take 1 capsule (40 mg total) by mouth daily.   topiramate (TOPAMAX) 50 MG tablet TAKE (1) TABLET BY MOUTH EVERY DAY   zolpidem (AMBIEN) 10 MG tablet Take 1 tablet (10 mg total) by mouth at bedtime. 1/2-1 tablet oral at bedtime.   meloxicam (MOBIC) 7.5 MG tablet Take 1 tablet (7.5 mg total) by mouth daily. (Patient not taking: Reported on 02/01/2023)   No facility-administered encounter medications on file as of 02/01/2023.    Allergies (verified) Nisoldipine and Sulfa antibiotics   History: Past Medical History:  Diagnosis Date   GERD (gastroesophageal reflux disease)    Headache    Migraines   History of colonic polyps 05/09/2006   Hypertension 03/20/2000   Pre-diabetes    Sleep apnea    Ulcer 1985   Not Sure of date or Year   Past Surgical History:  Procedure Laterality Date   APPENDECTOMY  10/31/1958   BRAIN SURGERY  07/02/1959   Removed pressure from brain caused by head injury   COLONOSCOPY WITH PROPOFOL N/A 03/25/2022   Procedure: COLONOSCOPY WITH PROPOFOL;  Surgeon: Lesly Rubenstein, MD;  Location: [facility] ENDOSCOPY;  Service: Endoscopy;  Laterality: N/A;   ESOPHAGOGASTRODUODENOSCOPY N/A 10/23/2019   Procedure: ESOPHAGOGASTRODUODENOSCOPY (EGD);  Surgeon: Lucilla Lame, MD;  [facility] END[facility];  Service: Endoscopy;  Laterality: N/A;   ESOPHAGOGASTRODUODENOSCOPY (EGD) WITH PROPOFOL N/A 03/25/2022   Procedure: ESOPHAGOGASTRODUODENOSCOPY (EGD) WITH PROPOFOL;  Surgeon: Lesly Rubenstein, MD;  Location: [facility] ENDOSCOPY;  Service: Endoscopy;   Laterality: N/A;   nasal abscess  10/31/1958   no information provider   TONSILLECTOMY     TONSILLECTOMY AND ADENOIDECTOMY  10/31/1958   UPPER GI ENDOSCOPY  02/20/2012   [facility], Normal Esophagus, Nornal Stomach, Normal duodenum, Dr. Candace Cruise; Dilated for dysphagia   UPPER GI ENDOSCOPY  09/16/2013   Dr. Tiffany Kocher; Changes suspicious for eosinophilic esophaigitis. Normal stomach and duodenum   VASECTOMY  10/31/1988   Family History  Problem Relation Age of Onset   Hypertension Mother    Arthritis Mother    Alcohol abuse Father    Cancer Father    Early death Father    Diabetes Brother    Early death Brother    Heart disease Brother    Heart disease Brother    Heart attack Brother    Diabetes Brother    Heart disease Maternal Grandmother    Alcohol abuse Paternal Uncle    Early death Paternal Uncle    Alcohol abuse Paternal Uncle    Early death Paternal Uncle    Social History   Socioeconomic History   Marital status: Married    Spouse name: Not on file   Number of children: 5   Years of education: Not on file   Highest education level: Not on file  Occupational History   Occupation: HVAC work   Tobacco Use   Smoking status: Never   Smokeless tobacco: Never  Vaping Use   Vaping Use: Never used  Substance and Sexual Activity   Alcohol use: Not Currently    Comment: rare   Drug use: Never   Sexual activity: Not Currently    Birth control/protection: None  Other Topics Concern   Not on file  Social History Narrative   Not on file   Social Determinants of Health   Financial Resource Strain: Low Risk  (02/01/2023)   Overall Financial Resource Strain (CARDIA)    Difficulty of Paying Living Expenses: Not very hard  Food Insecurity: No Food Insecurity (02/01/2023)   Hunger Vital Sign    Worried About Running Out of Food in the Last Year: Never true    Ran Out of Food in the Last Year: Never true  Transportation Needs: No Transportation Needs (02/01/2023)   PRAPARE -  Hydrologist (Medical): No    Lack of Transportation (Non-Medical): No  Physical Activity: Insufficiently Active (02/01/2023)   Exercise Vital Sign    Days of Exercise per Week: 2 days    Minutes of Exercise per Session: 10 min  Stress: No Stress Concern Present (02/01/2023)   [place]    Feeling of Stress : Not at all  Social Connections: Unknown (02/01/2023)   Social Connection and Isolation Panel [NHANES]    Frequency of Communication with Friends and Family: Once a week    Frequency of Social Gatherings with Friends and Family: Once a week  Attends Religious Services: Not on file    Active Member of Clubs or Organizations: Yes    Attends Archivist Meetings: 1 to 4 times per year    Marital Status: Married    Tobacco Counseling Counseling given: Not Answered   Clinical Intake:  Pre-visit preparation completed: Yes  Pain : No/denies pain     BMI - recorded: 28.84 Nutritional Status: BMI 25 -29 Overweight Nutritional Risks: None Diabetes: No  How often do you need to have someone help you when you read instructions, pamphlets, or other written materials from your doctor or pharmacy?: 1 - Never  Diabetic?no  Interpreter Needed?: No  Information entered by :: B.Emmalea Treanor,LPN   Activities of Daily Living    02/01/2023   12:48 PM 03/04/2022    9:03 AM  In your present state of health, do you have any difficulty performing the following activities:  Hearing? 0 0  Vision? 0 0  Difficulty concentrating or making decisions? 0 0  Walking or climbing stairs? 0 0  Dressing or bathing? 0 0  Doing errands, shopping? 0 0  Preparing Food and eating ? N N  Using the Toilet? N N  In the past six months, have you accidently leaked urine? Y Y  Do you have problems with loss of bowel control? N N  Managing your Medications? N N  Managing your Finances? N N  Housekeeping or  managing your Housekeeping? N N    Patient Care Team: Birdie Sons, MD as PCP - General (Family Medicine) Rockey Situ Kathlene November, MD as Consulting Physician (Cardiology) Haig Prophet Hilton Cork, MD as Consulting Physician (Gastroenterology)  Indicate any recent Medical Services you may have received from other than [facility] providers in the past year (date may be approximate).     Assessment:   This is a routine wellness examination for Arthur Davis.  Hearing/Vision screen Hearing Screening - Comments:: Adequate hearing Vision Screening - Comments:: Adequate vision w/glasses-needs eye exam [facility]  Dietary issues and exercise activities discussed: Current Exercise Habits: Home exercise routine, Type of exercise: walking, Time (Minutes): 10, Frequency (Times/Week): 2, Weekly Exercise (Minutes/Week): 20, Intensity: Mild, Exercise limited by: orthopedic condition(s)   Goals Addressed             This Visit's Progress    DIET - EAT MORE FRUITS AND VEGETABLES   On track      Depression Screen    02/01/2023    1:41 PM 09/16/2022   10:17 AM 01/27/2022    1:07 PM 11/23/2021    9:47 AM 01/15/2021   10:20 AM 03/18/2020   10:22 AM 11/22/2017   10:17 AM  PHQ 2/9 Scores  PHQ - 2 Score 2 2 0 1 2 1  0  PHQ- 9 Score 5 5  2 8 8  0    Fall Risk    02/01/2023   12:48 PM 03/04/2022    9:03 AM 01/27/2022    1:10 PM 01/27/2022   10:51 AM 11/23/2021    9:47 AM  Fall Risk   Falls in the past year? 0 0 0 0 0  Number falls in past yr: 0 0 0 0 0  Injury with Fall? 0 0 0 0 0  Risk for fall due to :   No Fall Risks  No Fall Risks  Follow up   Falls evaluation completed      FALL RISK PREVENTION PERTAINING TO THE HOME:  Any stairs in or around the home? Yes outside and inside 3  steps If so, are there any without handrails? No  Home free of loose throw rugs in walkways, pet beds, electrical cords, etc? Yes  Adequate lighting in your home to reduce risk of falls? Yes   ASSISTIVE DEVICES UTILIZED TO PREVENT  FALLS:  Life alert? No  Use of a cane, walker or w/c? No  Grab bars in the bathroom? No  Shower chair or bench in shower? No  Elevated toilet seat or a handicapped toilet? No   Cognitive Function:        02/01/2023    1:46 PM  6CIT Screen  What Year? 0 points  What month? 0 points  What time? 0 points  Count back from 20 0 points  Months in reverse 0 points  Repeat phrase 0 points  Total Score 0 points    Immunizations Immunization History  Administered Date(s) Administered   Fluad Quad(high Dose 65+) 08/06/2020, 10/06/2021, 09/16/2022   Influenza Inj Mdck Quad With Preservative 10/07/2019   Influenza,inj,Quad PF,6+ Mos 11/22/2016, 07/05/2017   Influenza-Unspecified 10/06/2021   Moderna SARS-COV2 Booster Vaccination 10/06/2021   PFIZER(Purple Top)SARS-COV-2 Vaccination 01/05/2020, 01/26/2020, 08/27/2020   PNEUMOCOCCAL CONJUGATE-20 11/23/2021   Pneumococcal Polysaccharide-23 09/29/2020   Tdap 05/12/2012   Zoster, Live 07/13/2015    TDAP status: Up to date  Flu Vaccine status: Up to date  Pneumococcal vaccine status: Up to date  Covid-19 vaccine status: Completed vaccines  Qualifies for Shingles Vaccine? Yes   Zostavax completed Yes   Shingrix Completed?: Yes  Screening Tests Health Maintenance  Topic Date Due   Zoster Vaccines- Shingrix (1 of 2) Never done   DTaP/Tdap/Td (2 - Td or Tdap) 05/12/2022   COVID-19 Vaccine (4 - 2023-24 season) 07/01/2022   INFLUENZA VACCINE  06/01/2023   Medicare Annual Wellness (AWV)  02/01/2024   COLONOSCOPY (Pts 45-55yrs Insurance coverage will need to be confirmed)  03/25/2029   Pneumonia Vaccine 50+ Years old  Completed   Hepatitis C Screening  Completed   HPV VACCINES  Aged Out    Health Maintenance  Health Maintenance Due  Topic Date Due   Zoster Vaccines- Shingrix (1 of 2) Never done   DTaP/Tdap/Td (2 - Td or Tdap) 05/12/2022   COVID-19 Vaccine (4 - 2023-24 season) 07/01/2022    Colorectal cancer screening:  Type of screening: Colonoscopy. Completed yes. Repeat every 7 years  Lung Cancer Screening: (Low Dose CT Chest recommended if Age 1-80 years, 30 pack-year currently smoking OR have quit w/in 15years.) does not qualify.   Lung Cancer Screening Referral: no  Additional Screening:  Hepatitis C Screening: does not qualify; Completed yes  Vision Screening: Recommended annual ophthalmology exams for early detection of glaucoma and other disorders of the eye. Is the patient up to date with their annual eye exam?  No  Who is the provider or what is the name of the office in which the patient attends annual eye exams? Will call [facility] If pt is not established with a provider, would they like to be referred to a provider to establish care? No .   Dental Screening: Recommended annual dental exams for proper oral hygiene  Community Resource Referral / Chronic Care Management: CRR required this visit?  No   CCM required this visit?  No      Plan:     I have personally reviewed and noted the following in the patient's chart:   Medical and social history Use of alcohol, tobacco or illicit drugs  Current medications and supplements including opioid  prescriptions. Patient is not currently taking opioid prescriptions. Functional ability and status Nutritional status Physical activity Advanced directives List of other physicians Hospitalizations, surgeries, and ER visits in previous 12 months Vitals Screenings to include cognitive, depression, and falls Referrals and appointments  In addition, I have reviewed and discussed with patient certain preventive protocols, quality metrics, and best practice recommendations. A written personalized care plan for preventive services as well as general preventive health recommendations were provided to patient.     Roger Shelter, LPN   [id number]   Nurse Notes: pt is doing well. He does inquire of wanting to get labwork done (as he says it has  been over 22mons without). He has no other concerns or questions.

## 2023-04-07 ENCOUNTER — Telehealth: Payer: Self-pay

## 2023-04-07 DIAGNOSIS — R7303 Prediabetes: Secondary | ICD-10-CM

## 2023-04-07 NOTE — Telephone Encounter (Signed)
Copied from CRM [id number]. Topic: General - Other >> Apr 07, 2023 12:47 PM Ja-Kwan M wrote: Reason for CRM: Pt requests lab order so he can get labs drawn for A1C

## 2023-04-09 NOTE — Telephone Encounter (Signed)
Order has been placed.

## 2023-04-09 NOTE — Addendum Note (Signed)
Addended by: Malva Limes on: 04/09/2023 08:38 AM   Modules accepted: Orders

## 2023-04-10 ENCOUNTER — Other Ambulatory Visit: Payer: Self-pay | Admitting: Family Medicine

## 2023-04-10 DIAGNOSIS — K219 Gastro-esophageal reflux disease without esophagitis: Secondary | ICD-10-CM

## 2023-04-10 DIAGNOSIS — F5101 Primary insomnia: Secondary | ICD-10-CM

## 2023-04-10 DIAGNOSIS — I10 Essential (primary) hypertension: Secondary | ICD-10-CM

## 2023-04-26 LAB — HEMOGLOBIN A1C
Est. average glucose Bld gHb Est-mCnc: 108 mg/dL
Hgb A1c MFr Bld: 5.4 % (ref 4.8–5.6)

## 2023-04-28 ENCOUNTER — Telehealth: Payer: Self-pay

## 2023-04-28 NOTE — Telephone Encounter (Signed)
Review of chart reveals they were faxed 04/12/23. They must not have gotten them.  Please refax    Copied from CRM [id number]. Topic: General - Other >> Apr 27, 2023  3:25 PM Franchot Heidelberg wrote: Reason for CRM: [employer] called to see if the detailed written order for CPAP supplies was received on 04/25/2023  Best contact: [phone] Fax: [phone]

## 2023-05-02 NOTE — Telephone Encounter (Signed)
Rochelle from [employer] called to check status of request

## 2023-05-08 ENCOUNTER — Telehealth: Payer: Self-pay | Admitting: Family Medicine

## 2023-05-08 NOTE — Telephone Encounter (Signed)
Arthur Davis called stated they have not recvd the paperwork that was faxed to [employer] on 7/3 so she is requesting that it be faxed to her directly to her attn at [phone]. If there are any additional questions she can be reached at [phone]

## 2023-05-19 NOTE — Telephone Encounter (Signed)
Was this done?

## 2023-06-07 ENCOUNTER — Other Ambulatory Visit: Payer: Self-pay | Admitting: Nurse Practitioner

## 2023-06-07 DIAGNOSIS — R161 Splenomegaly, not elsewhere classified: Secondary | ICD-10-CM

## 2023-06-07 DIAGNOSIS — K760 Fatty (change of) liver, not elsewhere classified: Secondary | ICD-10-CM

## 2023-08-23 ENCOUNTER — Ambulatory Visit (INDEPENDENT_AMBULATORY_CARE_PROVIDER_SITE_OTHER): Payer: Medicare Other | Admitting: Physician Assistant

## 2023-08-23 DIAGNOSIS — Z23 Encounter for immunization: Secondary | ICD-10-CM | POA: Diagnosis not present

## 2023-09-15 ENCOUNTER — Other Ambulatory Visit: Payer: Self-pay | Admitting: Family Medicine

## 2023-09-15 DIAGNOSIS — I10 Essential (primary) hypertension: Secondary | ICD-10-CM

## 2023-09-15 DIAGNOSIS — G43809 Other migraine, not intractable, without status migrainosus: Secondary | ICD-10-CM

## 2023-10-05 ENCOUNTER — Other Ambulatory Visit: Payer: Self-pay | Admitting: Family Medicine

## 2023-10-05 DIAGNOSIS — K219 Gastro-esophageal reflux disease without esophagitis: Secondary | ICD-10-CM

## 2023-10-06 NOTE — Telephone Encounter (Signed)
Requested medication (s) are due for refill today: yes  Requested medication (s) are on the active medication list: yes  Last refill:  04/11/23 #90/1  Future visit scheduled: no  Notes to clinic:  Unable to refill per protocol, appointment needed.      Requested Prescriptions  Pending Prescriptions Disp Refills   omeprazole (PRILOSEC) 40 MG capsule [Pharmacy Med Name: OMEPRAZOLE 40MG  CAPSULE DR] 90 capsule 1    Sig: TAKE ONE (1) CAPSULE BY MOUTH DAILY     Gastroenterology: Proton Pump Inhibitors Passed - 10/05/2023 11:53 AM      Passed - Valid encounter within last 12 months    Recent Outpatient Visits           1 year ago Vitamin D deficiency   [place] [facility] Malva Limes, MD   1 year ago Essential (primary) hypertension   [place] [facility] Malva Limes, MD   1 year ago Anterolisthesis of lumbar spine   [place] [facility] Malva Limes, MD   1 year ago Pain of upper abdomen   [facility] Malva Limes, MD   2 years ago Heart palpitations   [place] [facility] Malva Limes, MD

## 2023-11-28 ENCOUNTER — Ambulatory Visit: Payer: Self-pay | Admitting: *Deleted

## 2023-11-28 NOTE — Telephone Encounter (Signed)
  Chief Complaint: See pt's message    Pharmacist instructed him to contact PCP first.  He is inquiring about taking Sudafed for head cold but is on BP medications. Symptoms: Head cold Frequency: N/A Pertinent Negatives: Patient denies N/A Disposition: [] ED /[] Urgent Care (no appt availability in office) / [] Appointment(In office/virtual)/ []  [place] [facility]/ [] Home Care/ [] Refused Recommended Disposition /[] [place] [facility]/ [x]  Follow-up with PCP Additional Notes: Pt's message forwarded to Dr. Sherrie Mustache.

## 2023-11-28 NOTE — Telephone Encounter (Signed)
Message from [place] F sent at 11/28/2023  3:20 PM EST  Summary: Speak with doctor about medication   Pt is calling in because he wants to speak with Dr. Sherrie Mustache about taking Sudafed for his head cold. The pharmacist told him to reach out to provider because of his blood pressure medications before taking the medication. Pt would like to speak with provider regarding this.          Call History  Contact Date/Time Type Contact Phone/Fax By  11/28/2023 03:18 PM EST Phone (Incoming) Eriel, Doyon (Self) [phone] Judie Petit) Colon Flattery M   Reason for Disposition  [1] Caller has URGENT medicine question about med that PCP or specialist prescribed AND [2] triager unable to answer question  Answer Assessment - Initial Assessment Questions 1. NAME of MEDICINE: "What medicine(s) are you calling about?"     See pt message. Pharmacist instructed him to contact his provider first regarding taking Sudafed for his head cold. 2. QUESTION: "What is your question?" (e.g., double dose of medicine, side effect)     He is on BP medications so pharmacist told him to contact PCP. 3. PRESCRIBER: "Who prescribed the medicine?" Reason: if prescribed by specialist, call should be referred to that group.     For Dr Sherrie Mustache 4. SYMPTOMS: "Do you have any symptoms?" If Yes, ask: "What symptoms are you having?"  "How bad are the symptoms (e.g., mild, moderate, severe)     Head cold and inquiring about taking Sudafed.    5. PREGNANCY:  "Is there any chance that you are pregnant?" "When was your last menstrual period?"     N/A  Protocols used: Medication Question Call-A-AH

## 2023-11-29 NOTE — Telephone Encounter (Signed)
No, I do not recommend taking sudafed for a head cold. He can use OTC Coricidin, a decongestant nasal spray or nasal strips to relieve nasal congestion.

## 2023-11-30 ENCOUNTER — Other Ambulatory Visit: Payer: Self-pay | Admitting: Family Medicine

## 2023-11-30 DIAGNOSIS — R7303 Prediabetes: Secondary | ICD-10-CM

## 2023-12-01 NOTE — Telephone Encounter (Signed)
 Pt advised. Verbalized understanding.

## 2023-12-01 NOTE — Telephone Encounter (Signed)
Requested medications are due for refill today.  yes  Requested medications are on the active medications list.  yes  Last refill. 09/11/2022 #90 4 rf  Future visit scheduled.   no  Notes to clinic.  Labs are expired.    Requested Prescriptions  Pending Prescriptions Disp Refills   metFORMIN (GLUCOPHAGE-XR) 500 MG 24 hr tablet [Pharmacy Med Name: METFORMIN HCL ER 500 MG TABLET] 90 tablet 4    Sig: TAKE 1 TABLET BY MOUTH EVERY DAY     Endocrinology:  Diabetes - Biguanides Failed - 12/01/2023  8:45 AM      Failed - Cr in normal range and within 360 days    Creatinine  Date Value Ref Range Status  10/25/2012 1.41 (H) 0.60 - 1.30 mg/dL Final   Creatinine, Ser  Date Value Ref Range Status  03/04/2022 1.14 0.76 - 1.27 mg/dL Final         Failed - HBA1C is between 0 and 7.9 and within 180 days    Hgb A1c MFr Bld  Date Value Ref Range Status  04/25/2023 5.4 4.8 - 5.6 % Final    Comment:             Prediabetes: 5.7 - 6.4          Diabetes: >6.4          Glycemic control for adults with diabetes: <7.0          Failed - eGFR in normal range and within 360 days    EGFR (African American)  Date Value Ref Range Status  10/25/2012 >60  Final   GFR calc Af Amer  Date Value Ref Range Status  02/21/2020 86 >59 mL/min/1.73 Final    Comment:    **[facility] currently reports eGFR in compliance with the current**   recommendations of the [employer]. [facility] will   update reporting as new guidelines are published from the NKF-ASN   Task force.    EGFR (Non-African Amer.)  Date Value Ref Range Status  10/25/2012 55 (L)  Final    Comment:    eGFR values <38mL/min/1.73 m2 may be an indication of chronic kidney disease (CKD). Calculated eGFR is useful in patients with stable renal function. The eGFR calculation will not be reliable in acutely ill patients when serum creatinine is changing rapidly. It is not useful in  patients on dialysis. The eGFR calculation may  not be applicable to patients at the low and high extremes of body sizes, pregnant women, and vegetarians.    GFR, Estimated  Date Value Ref Range Status  07/20/2021 >60 >60 mL/min Final    Comment:    (NOTE) Calculated using the CKD-EPI Creatinine Equation (2021)    eGFR  Date Value Ref Range Status  03/04/2022 71 >59 mL/min/1.73 Final         Failed - B12 Level in normal range and within 720 days    No results found for: "VITAMINB12"       Failed - Valid encounter within last 6 months    Recent Outpatient Visits           1 year ago Vitamin D deficiency   [place] [facility] Malva Limes, MD   1 year ago Essential (primary) hypertension   [place] [facility] Malva Limes, MD   1 year ago Anterolisthesis of lumbar spine   [place] [facility] Malva Limes, MD   2 years ago Pain of  upper abdomen   [facility] Malva Limes, MD   2 years ago Heart palpitations   [place] [facility] Malva Limes, MD              Failed - CBC within normal limits and completed in the last 12 months    WBC  Date Value Ref Range Status  11/23/2021 6.4 3.4 - 10.8 x10E3/uL Final  07/20/2021 8.3 4.0 - 10.5 K/uL Final   RBC  Date Value Ref Range Status  11/23/2021 4.74 4.14 - 5.80 x10E6/uL Final  07/20/2021 RESULTS UNAVAILABLE DUE TO INTERFERING SUBSTANCE 4.22 - 5.81 MIL/uL Final   Hemoglobin  Date Value Ref Range Status  11/23/2021 15.3 13.0 - 17.7 g/dL Final   Hematocrit  Date Value Ref Range Status  11/23/2021 42.8 37.5 - 51.0 % Final   MCHC  Date Value Ref Range Status  11/23/2021 35.7 31.5 - 35.7 g/dL Final  29/52/8413 RESULTS UNAVAILABLE DUE TO INTERFERING SUBSTANCE 30.0 - 36.0 g/dL Final   [facility]  Date Value Ref Range Status  11/23/2021 32.3 26.6 - 33.0 pg Final  07/20/2021 RESULTS UNAVAILABLE DUE TO INTERFERING SUBSTANCE 26.0 - 34.0 pg Final   MCV   Date Value Ref Range Status  11/23/2021 90 79 - 97 fL Final  10/25/2012 91 80 - 100 fL Final   No results found for: "PLTCOUNTKUC", "LABPLAT", "POCPLA" RDW  Date Value Ref Range Status  11/23/2021 12.0 11.6 - 15.4 % Final  10/25/2012 13.3 11.5 - 14.5 % Final

## 2023-12-26 ENCOUNTER — Telehealth: Payer: Self-pay | Admitting: Family Medicine

## 2023-12-26 ENCOUNTER — Other Ambulatory Visit: Payer: Self-pay

## 2023-12-26 DIAGNOSIS — K219 Gastro-esophageal reflux disease without esophagitis: Secondary | ICD-10-CM

## 2023-12-26 DIAGNOSIS — I10 Essential (primary) hypertension: Secondary | ICD-10-CM

## 2023-12-26 DIAGNOSIS — M545 Other chronic pain: Secondary | ICD-10-CM

## 2023-12-26 DIAGNOSIS — R7303 Prediabetes: Secondary | ICD-10-CM

## 2023-12-26 DIAGNOSIS — G43809 Other migraine, not intractable, without status migrainosus: Secondary | ICD-10-CM

## 2023-12-26 NOTE — Telephone Encounter (Signed)
 [facility] faxed refill request for the following medications:   meloxicam (MOBIC) 7.5 MG tablet    topiramate (TOPAMAX) 50 MG tablet   omeprazole (PRILOSEC) 40 MG capsule  Please advise

## 2023-12-26 NOTE — Telephone Encounter (Signed)
 [facility] pharmacy faxed refill request for the following medications:  metFORMIN (GLUCOPHAGE-XR) 500 MG 24 hr tablet   Please advise

## 2023-12-26 NOTE — Telephone Encounter (Signed)
 [facility] sent fax asking for refills on Amlodipine, Irbesartan-hydrochlorothiazide, Magnesium oxide

## 2023-12-29 ENCOUNTER — Telehealth: Payer: Self-pay | Admitting: Family Medicine

## 2023-12-29 DIAGNOSIS — K219 Gastro-esophageal reflux disease without esophagitis: Secondary | ICD-10-CM

## 2023-12-29 DIAGNOSIS — R7303 Prediabetes: Secondary | ICD-10-CM

## 2023-12-29 DIAGNOSIS — I10 Essential (primary) hypertension: Secondary | ICD-10-CM

## 2023-12-29 DIAGNOSIS — G43809 Other migraine, not intractable, without status migrainosus: Secondary | ICD-10-CM

## 2023-12-29 MED ORDER — OMEPRAZOLE 40 MG PO CPDR: 40.0000 mg | DELAYED_RELEASE_CAPSULE | Freq: Every day | ORAL | 0 refills | Status: DC

## 2023-12-29 MED ORDER — TOPIRAMATE 50 MG PO TABS: 50.0000 mg | ORAL_TABLET | Freq: Every day | ORAL | 0 refills | Status: DC

## 2023-12-29 MED ORDER — AMLODIPINE BESYLATE 2.5 MG PO TABS
2.5000 mg | ORAL_TABLET | Freq: Every day | ORAL | 1 refills | Status: DC
Start: 2023-12-29 — End: 2024-05-22

## 2023-12-29 MED ORDER — MAGNESIUM OXIDE (LAXATIVE) 500 MG PO TABS: 500.0000 mg | ORAL_TABLET | Freq: Every day | ORAL | 0 refills | Status: DC

## 2023-12-29 MED ORDER — IRBESARTAN-HYDROCHLOROTHIAZIDE 150-12.5 MG PO TABS: 1.0000 | ORAL_TABLET | Freq: Every day | ORAL | 0 refills | Status: DC

## 2023-12-29 MED ORDER — METFORMIN HCL ER 500 MG PO TB24
500.0000 mg | ORAL_TABLET | Freq: Every day | ORAL | 1 refills | Status: DC
Start: 2023-12-29 — End: 2024-05-22

## 2023-12-29 NOTE — Telephone Encounter (Signed)
 Received fax from [facility] pharmacy for prescriptions on all his meds, but he has not been seen since 2023. Please advise pt he needs to schedule in-person appointment before we can approve prescription request.

## 2023-12-29 NOTE — Telephone Encounter (Signed)
 Have scheduled patient for 03/10 with you for chronic issues. Patient has his AWV scheduled with [facility] 01/2024

## 2024-01-01 ENCOUNTER — Other Ambulatory Visit: Payer: Self-pay

## 2024-01-01 DIAGNOSIS — F5101 Primary insomnia: Secondary | ICD-10-CM

## 2024-01-01 NOTE — Telephone Encounter (Signed)
 Patient has not been seen by his PCP Sherrie Mustache) since 10/2021.      Copied from CRM [id number]. Topic: Clinical - Prescription Issue >> Jan 01, 2024  3:44 PM Ivette P wrote: Reason for CRM: Joyce Gross called in to follow up on a prescription request for pt. For the medication  zolpidem (AMBIEN) 10 MG tablet . [facility] would like a follow up.   Callback #[phone]

## 2024-01-02 MED ORDER — ZOLPIDEM TARTRATE 10 MG PO TABS: 10.0000 mg | ORAL_TABLET | Freq: Every evening | ORAL | 0 refills | Status: DC | PRN

## 2024-01-08 ENCOUNTER — Ambulatory Visit (INDEPENDENT_AMBULATORY_CARE_PROVIDER_SITE_OTHER): Payer: Medicare Other | Admitting: Family Medicine

## 2024-01-08 VITALS — BP 136/65 | HR 58 | Resp 16 | Ht 70.0 in | Wt 212.1 lb

## 2024-01-08 DIAGNOSIS — R7303 Prediabetes: Secondary | ICD-10-CM | POA: Diagnosis not present

## 2024-01-08 DIAGNOSIS — I10 Essential (primary) hypertension: Secondary | ICD-10-CM

## 2024-01-08 DIAGNOSIS — E781 Pure hyperglyceridemia: Secondary | ICD-10-CM

## 2024-01-08 DIAGNOSIS — E559 Vitamin D deficiency, unspecified: Secondary | ICD-10-CM

## 2024-01-08 DIAGNOSIS — F5101 Primary insomnia: Secondary | ICD-10-CM

## 2024-01-08 DIAGNOSIS — G4733 Obstructive sleep apnea (adult) (pediatric): Secondary | ICD-10-CM

## 2024-01-08 DIAGNOSIS — G43709 Chronic migraine without aura, not intractable, without status migrainosus: Secondary | ICD-10-CM

## 2024-01-08 DIAGNOSIS — Z125 Encounter for screening for malignant neoplasm of prostate: Secondary | ICD-10-CM

## 2024-01-08 MED ORDER — ZOLPIDEM TARTRATE 10 MG PO TABS: 10.0000 mg | ORAL_TABLET | Freq: Every evening | ORAL | 1 refills | Status: AC | PRN

## 2024-01-08 NOTE — Progress Notes (Unsigned)
 Established patient visit   Patient: Arthur Davis   DOB: 02-15-1956   68 y.o. Male  MRN: [id number] Visit Date: 01/08/2024  Today's healthcare provider: Mila Merry, MD   Chief Complaint  Patient presents with   Medical Management of Chronic Issues   Hypertension   Subjective    Hypertension Pertinent negatives include no chest pain, palpitations or shortness of breath.    Presents follow up htn, pre-diabetes, hyperlipidemia, migraine history. Has been going through stress related to grandson severely ill in [place] and requires lung transplant. Been fatigued which he feels is related to stress. Otherwise no new physical complaints. Takes Ambien a few times each week and remains effective. Needs prescription and would like to get from mail order pharmacy.   Lab Results  Component Value Date   NA 142 03/04/2022   K 4.1 03/04/2022   CREATININE 1.14 03/04/2022   EGFR 71 03/04/2022   GLUCOSE 110 (H) 03/04/2022   Lab Results  Component Value Date   CHOL 143 03/04/2022   HDL 41 03/04/2022   LDLCALC 80 03/04/2022   TRIG 123 03/04/2022   CHOLHDL 3.5 03/04/2022   Lab Results  Component Value Date   TSH 1.002 07/20/2021     Medications: Outpatient Medications Prior to Visit  Medication Sig   amLODipine (NORVASC) 2.5 MG tablet Take 1 tablet (2.5 mg total) by mouth daily.   Flaxseed, Linseed, (RA FLAX SEED OIL 1000 PO) Take 1 capsule by mouth daily.   irbesartan-hydrochlorothiazide (AVALIDE) 150-12.5 MG tablet Take 1 tablet by mouth daily. Please schedule office visit before any future refill.   Magnesium Oxide, Laxative, 500 MG TABS Take 1 tablet (500 mg total) by mouth daily.   metFORMIN (GLUCOPHAGE-XR) 500 MG 24 hr tablet Take 1 tablet (500 mg total) by mouth daily.   Omega-3 Fatty Acids (FISH OIL CONCENTRATE) 1000 MG CAPS Take 1 capsule by mouth daily.    omeprazole (PRILOSEC) 40 MG capsule Take 1 capsule (40 mg total) by mouth daily.   topiramate (TOPAMAX) 50 MG  tablet Take 1 tablet (50 mg total) by mouth daily.   zolpidem (AMBIEN) 10 MG tablet Take 1 tablet (10 mg total) by mouth at bedtime as needed for sleep. Keep scheduled appointment for further refills   Cholecalciferol 125 MCG (5000 UT) TABS Take 1,000 Units by mouth daily.   meloxicam (MOBIC) 7.5 MG tablet Take 1 tablet (7.5 mg total) by mouth daily.   No facility-administered medications prior to visit.    Review of Systems  Constitutional:  Positive for fatigue. Negative for appetite change, chills and fever.  Respiratory:  Negative for chest tightness, shortness of breath and wheezing.   Cardiovascular:  Negative for chest pain and palpitations.  Gastrointestinal:  Negative for abdominal pain, nausea and vomiting.   {Insert previous labs (optional):23779} {See past labs  Heme  Chem  Endocrine  Serology  Results Review (optional):1}   Objective    BP 136/65 (BP Location: Left Arm, Patient Position: Sitting, Cuff Size: Large)   Pulse (!) 58   Resp 16   Ht 5\' 10"  (1.778 m)   Wt 212 lb 1.6 oz (96.2 kg)   SpO2 100%   BMI 30.43 kg/m  {Insert last BP/Wt (optional):23777}{See vitals history (optional):1}  Physical Exam   General: Appearance:    Mildly obese male in no acute distress  Eyes:    PERRL, conjunctiva/corneas clear, EOM's intact       Lungs:  Clear to auscultation bilaterally, respirations unlabored  Heart:    Bradycardic. Normal rhythm. No murmurs, rubs, or gallops.    MS:   All extremities are intact.    Neurologic:   Awake, alert, oriented x 3. No apparent focal neurological defect.        Assessment & Plan     1. Essential (primary) hypertension (Primary) Well controlled.  Continue current medications.    2. Hyperglyceridemia, pure Diet controlled.  - CBC - Comprehensive metabolic panel - Lipid panel  3. Prediabetes  - Hemoglobin A1c  4. Vitamin D deficiency  - VITAMIN D 25 Hydroxy (Vit-D Deficiency, Fractures)  5. OSA on CPAP   6. Primary  insomnia refill - zolpidem (AMBIEN) 10 MG tablet; Take 1 tablet (10 mg total) by mouth at bedtime as needed for sleep.  Dispense: 45 tablet; Refill: 1  7. Prostate cancer screening  - PSA Total (Reflex To Free)  8. Migraine history Continue on topiramate       Mila Merry, MD  [facility] [phone] (phone) [phone] (fax)  [facility]

## 2024-01-10 ENCOUNTER — Encounter: Payer: Self-pay | Admitting: Family Medicine

## 2024-01-10 LAB — PSA TOTAL (REFLEX TO FREE): Prostate Specific Ag, Serum: 1.6 ng/mL (ref 0.0–4.0)

## 2024-01-10 LAB — HEMOGLOBIN A1C
Est. average glucose Bld gHb Est-mCnc: 114 mg/dL
Hgb A1c MFr Bld: 5.6 % (ref 4.8–5.6)

## 2024-01-10 LAB — CBC
Hematocrit: 45.7 % (ref 37.5–51.0)
Hemoglobin: 16.1 g/dL (ref 13.0–17.7)
MCH: 32.7 pg (ref 26.6–33.0)
MCHC: 35.2 g/dL (ref 31.5–35.7)
MCV: 93 fL (ref 79–97)
Platelets: 162 10*3/uL (ref 150–450)
RBC: 4.93 x10E6/uL (ref 4.14–5.80)
RDW: 13 % (ref 11.6–15.4)
WBC: 6.3 10*3/uL (ref 3.4–10.8)

## 2024-01-10 LAB — COMPREHENSIVE METABOLIC PANEL
ALT: 21 IU/L (ref 0–44)
AST: 16 IU/L (ref 0–40)
Albumin: 4.7 g/dL (ref 3.9–4.9)
Alkaline Phosphatase: 65 IU/L (ref 44–121)
BUN/Creatinine Ratio: 14 (ref 10–24)
BUN: 13 mg/dL (ref 8–27)
Bilirubin Total: 0.9 mg/dL (ref 0.0–1.2)
CO2: 22 mmol/L (ref 20–29)
Calcium: 9.1 mg/dL (ref 8.6–10.2)
Chloride: 105 mmol/L (ref 96–106)
Creatinine, Ser: 0.96 mg/dL (ref 0.76–1.27)
Globulin, Total: 1.8 g/dL (ref 1.5–4.5)
Glucose: 125 mg/dL — ABNORMAL HIGH (ref 70–99)
Potassium: 3.9 mmol/L (ref 3.5–5.2)
Sodium: 141 mmol/L (ref 134–144)
Total Protein: 6.5 g/dL (ref 6.0–8.5)
eGFR: 86 mL/min/{1.73_m2} (ref >59)

## 2024-01-10 LAB — LIPID PANEL
Chol/HDL Ratio: 3.7 ratio (ref 0.0–5.0)
Cholesterol, Total: 158 mg/dL (ref 100–199)
HDL: 43 mg/dL (ref >39)
LDL Chol Calc (NIH): 84 mg/dL (ref 0–99)
Triglycerides: 179 mg/dL — ABNORMAL HIGH (ref 0–149)
VLDL Cholesterol Cal: 31 mg/dL (ref 5–40)

## 2024-01-10 LAB — VITAMIN D 25 HYDROXY (VIT D DEFICIENCY, FRACTURES): Vit D, 25-Hydroxy: 57.5 ng/mL (ref 30.0–100.0)

## 2024-01-26 ENCOUNTER — Other Ambulatory Visit: Payer: Self-pay | Admitting: Family Medicine

## 2024-01-26 DIAGNOSIS — I10 Essential (primary) hypertension: Secondary | ICD-10-CM

## 2024-01-26 DIAGNOSIS — G43809 Other migraine, not intractable, without status migrainosus: Secondary | ICD-10-CM

## 2024-02-07 ENCOUNTER — Ambulatory Visit: Payer: Self-pay

## 2024-02-07 DIAGNOSIS — Z0000 Encounter for general adult medical examination without abnormal findings: Secondary | ICD-10-CM

## 2024-02-07 NOTE — Progress Notes (Signed)
 Subjective:   Arthur Davis is a 68 y.o. who presents for a Medicare Wellness preventive visit.  Visit Complete: Virtual I connected with  Arthur Davis on 02/07/24 by a audio enabled telemedicine application and verified that I am speaking with the correct person using two identifiers.  Patient Location: Home  Provider Location: Office/Clinic  I discussed the limitations of evaluation and management by telemedicine. The patient expressed understanding and agreed to proceed.  Vital Signs: Because this visit was a virtual/telehealth visit, some criteria may be missing or patient reported. Any vitals not documented were not able to be obtained and vitals that have been documented are patient reported.  VideoDeclined- This patient declined Librarian, academic. Therefore the visit was completed with audio only.  Persons Participating in Visit: Patient.  AWV Questionnaire: No: Patient Medicare AWV questionnaire was not completed prior to this visit.  Cardiac Risk Factors include: advanced age (>80men, >55 women);hypertension;male gender;sedentary lifestyle;obesity (BMI >30kg/m2)     Objective:    There were no vitals filed for this visit. There is no height or weight on file to calculate BMI.     02/07/2024    1:22 PM 02/01/2023    1:44 PM 03/25/2022    9:58 AM 01/27/2022    1:08 PM 07/20/2021    3:07 PM 03/06/2021    3:39 PM 10/23/2019   11:12 PM  Advanced Directives  Does Patient Have a Medical Advance Directive? No Yes  Yes No Yes Yes  Type of Aeronautical engineer of [place];Living will  Healthcare Power of [place];Living will Living will  Does patient want to make changes to medical advance directive?    Yes (Inpatient - patient defers changing a medical advance directive and declines information at this time)     Copy of Healthcare Power of Attorney in Chart?    No - copy requested     Would patient like information on creating a  medical advance directive? No - Patient declined           Information is confidential and restricted. Go to Review Flowsheets to unlock data.    Current Medications (verified) Outpatient Encounter Medications as of 02/07/2024  Medication Sig   amLODipine (NORVASC) 2.5 MG tablet Take 1 tablet (2.5 mg total) by mouth daily.   Cholecalciferol 125 MCG (5000 UT) TABS Take 1,000 Units by mouth daily.   Flaxseed, Linseed, (RA FLAX SEED OIL 1000 PO) Take 1 capsule by mouth daily.   irbesartan-hydrochlorothiazide (AVALIDE) 150-12.5 MG tablet TAKE 1 TABLET BY MOUTH DAILY. PLEASE SCHEDULE OFFICE VISIT BEFORE ANY FUTURE REFILL.   Magnesium Oxide -Mg Supplement 500 MG TABS TAKE 1 TABLET EVERY DAY   meloxicam (MOBIC) 7.5 MG tablet Take 1 tablet (7.5 mg total) by mouth daily.   metFORMIN (GLUCOPHAGE-XR) 500 MG 24 hr tablet Take 1 tablet (500 mg total) by mouth daily.   Omega-3 Fatty Acids (FISH OIL CONCENTRATE) 1000 MG CAPS Take 1 capsule by mouth daily.    omeprazole (PRILOSEC) 40 MG capsule Take 1 capsule (40 mg total) by mouth daily.   topiramate (TOPAMAX) 50 MG tablet TAKE 1 TABLET EVERY DAY   zolpidem (AMBIEN) 10 MG tablet Take 1 tablet (10 mg total) by mouth at bedtime as needed for sleep.   [DISCONTINUED] Magnesium Oxide, Laxative, 500 MG TABS Take 1 tablet (500 mg total) by mouth daily.   No facility-administered encounter medications on file as of 02/07/2024.    Allergies (verified)  Nisoldipine and Sulfa antibiotics   History: Past Medical History:  Diagnosis Date   GERD (gastroesophageal reflux disease)    Headache    Migraines   History of colonic polyps 05/09/2006   Hypertension 03/20/2000   Pre-diabetes    Sleep apnea    Ulcer 1985   Not Sure of date or Year   Past Surgical History:  Procedure Laterality Date   APPENDECTOMY  10/31/1958   BRAIN SURGERY  07/02/1959   Removed pressure from brain caused by head injury   COLONOSCOPY WITH PROPOFOL N/A 03/25/2022   Procedure:  COLONOSCOPY WITH PROPOFOL;  Surgeon: Regis Bill, MD;  Location: [facility] ENDOSCOPY;  Service: Endoscopy;  Laterality: N/A;   ESOPHAGOGASTRODUODENOSCOPY N/A 10/23/2019   Procedure: ESOPHAGOGASTRODUODENOSCOPY (EGD);  Surgeon: Midge Minium, MD;  Location: [facility] ENDOSCOPY;  Service: Endoscopy;  Laterality: N/A;   ESOPHAGOGASTRODUODENOSCOPY (EGD) WITH PROPOFOL N/A 03/25/2022   Procedure: ESOPHAGOGASTRODUODENOSCOPY (EGD) WITH PROPOFOL;  Surgeon: Regis Bill, MD;  Location: [facility] ENDOSCOPY;  Service: Endoscopy;  Laterality: N/A;   nasal abscess  10/31/1958   no information provider   TONSILLECTOMY     TONSILLECTOMY AND ADENOIDECTOMY  10/31/1958   UPPER GI ENDOSCOPY  02/20/2012   [facility], Normal Esophagus, Nornal Stomach, Normal duodenum, Dr. Bluford Kaufmann; Dilated for dysphagia   UPPER GI ENDOSCOPY  09/16/2013   Dr. Markham Jordan; Changes suspicious for eosinophilic esophaigitis. Normal stomach and duodenum   VASECTOMY  10/31/1988   Family History  Problem Relation Age of Onset   Hypertension Mother    Arthritis Mother    Alcohol abuse Father    Cancer Father    Early death Father    Diabetes Brother    Early death Brother    Heart disease Brother    Heart disease Brother    Heart attack Brother    Diabetes Brother    Heart disease Maternal Grandmother    Alcohol abuse Paternal Uncle    Early death Paternal Uncle    Alcohol abuse Paternal Uncle    Early death Paternal Uncle    Social History   Socioeconomic History   Marital status: Married    Spouse name: Not on file   Number of children: 5   Years of education: Not on file   Highest education level: Associate degree: occupational, Scientist, product/process development, or vocational program  Occupational History   Occupation: HVAC work   Tobacco Use   Smoking status: Never   Smokeless tobacco: Never  Vaping Use   Vaping status: Never Used  Substance and Sexual Activity   Alcohol use: Not Currently    Comment: rare   Drug use: Never   Sexual activity: Not  Currently    Birth control/protection: None  Other Topics Concern   Not on file  Social History Narrative   Not on file   Social Drivers of Health   Financial Resource Strain: Low Risk  (02/07/2024)   Overall Financial Resource Strain (CARDIA)    Difficulty of Paying Living Expenses: Not very hard  Food Insecurity: No Food Insecurity (02/07/2024)   Hunger Vital Sign    Worried About Running Out of Food in the Last Year: Never true    Ran Out of Food in the Last Year: Never true  Transportation Needs: No Transportation Needs (02/07/2024)   PRAPARE - Administrator, Civil Service (Medical): No    Lack of Transportation (Non-Medical): No  Physical Activity: Insufficiently Active (02/07/2024)   Exercise Vital Sign    Days of Exercise per Week:  2 days    Minutes of Exercise per Session: 20 min  Stress: Stress Concern Present (02/07/2024)   [employer] of Occupational Health - Occupational Stress Questionnaire    Feeling of Stress : To some extent  Social Connections: Moderately Isolated (02/07/2024)   Social Connection and Isolation Panel [NHANES]    Frequency of Communication with Friends and Family: Once a week    Frequency of Social Gatherings with Friends and Family: Never    Attends Religious Services: Never    Database administrator or Organizations: Yes    Attends Engineer, structural: 1 to 4 times per year    Marital Status: Married    Tobacco Counseling Counseling given: Not Answered    Clinical Intake:  Pre-visit preparation completed: Yes  Pain : No/denies pain     BMI - recorded: 30.4 Nutritional Status: BMI > 30  Obese Nutritional Risks: None Diabetes: No  Lab Results  Component Value Date   HGBA1C 5.6 01/09/2024   HGBA1C 5.4 04/25/2023   HGBA1C 5.4 09/16/2022     How often do you need to have someone help you when you read instructions, pamphlets, or other written materials from your doctor or pharmacy?: 1 - Never  Interpreter  Needed?: No  Information entered by :: Kennedy Bucker, LPN   Activities of Daily Living    02/07/2024    1:23 PM  In your present state of health, do you have any difficulty performing the following activities:  Hearing? 0  Vision? 0  Difficulty concentrating or making decisions? 0  Walking or climbing stairs? 0  Dressing or bathing? 0  Doing errands, shopping? 0  Preparing Food and eating ? N  Using the Toilet? N  In the past six months, have you accidently leaked urine? N  Do you have problems with loss of bowel control? N  Managing your Medications? N  Managing your Finances? N  Housekeeping or managing your Housekeeping? N    Patient Care Team: Malva Limes, MD as PCP - General (Family Medicine) Antonieta Iba, MD as Consulting Physician (Cardiology) Mia [place] Rossie Muskrat, MD as Consulting Physician (Gastroenterology)  Indicate any recent Medical Services you may have received from other than [facility] providers in the past year (date may be approximate).     Assessment:   This is a routine wellness examination for Arthur Davis.  Hearing/Vision screen Hearing Screening - Comments:: NO AIDS Vision Screening - Comments:: WEARS GLASSES-    Goals Addressed             This Visit's Progress    DIET - INCREASE WATER INTAKE         Depression Screen     02/07/2024    1:19 PM 01/08/2024    1:56 PM 02/01/2023    1:41 PM 09/16/2022   10:17 AM 01/27/2022    1:07 PM 11/23/2021    9:47 AM 01/15/2021   10:20 AM  PHQ 2/9 Scores  PHQ - 2 Score 0 3 2 2  0 1 2  PHQ- 9 Score 0 10 5 5  2 8     Fall Risk     02/07/2024    1:23 PM 01/08/2024    1:56 PM 02/01/2023   12:48 PM 03/04/2022    9:03 AM 01/27/2022    1:10 PM  Fall Risk   Falls in the past year? 0 0 0 0 0  Number falls in past yr: 0 0 0 0 0  Injury with Fall? 0  0 0 0 0  Risk for fall due to : No Fall Risks No Fall Risks   No Fall Risks  Follow up Falls prevention discussed;Falls evaluation completed    Falls evaluation  completed    MEDICARE RISK AT HOME:  Medicare Risk at Home Any stairs in or around the home?: No If so, are there any without handrails?: No Home free of loose throw rugs in walkways, pet beds, electrical cords, etc?: Yes Adequate lighting in your home to reduce risk of falls?: Yes Life alert?: No Use of a cane, walker or w/c?: No Grab bars in the bathroom?: No Shower chair or bench in shower?: No Elevated toilet seat or a handicapped toilet?: No  TIMED UP AND GO:  Was the test performed?  No  Cognitive Function: 6CIT completed        02/07/2024    1:24 PM 02/01/2023    1:46 PM  6CIT Screen  What Year? 0 points 0 points  What month? 0 points 0 points  What time? 0 points 0 points  Count back from 20 0 points 0 points  Months in reverse 0 points 0 points  Repeat phrase 2 points 0 points  Total Score 2 points 0 points    Immunizations Immunization History  Administered Date(s) Administered   Fluad Quad(high Dose 65+) 08/06/2020, 10/06/2021, 09/16/2022   Fluad Trivalent(High Dose 65+) 08/23/2023   Influenza Inj Mdck Quad With Preservative 10/07/2019   Influenza,inj,Quad PF,6+ Mos 11/22/2016, 07/05/2017   Influenza-Unspecified 10/06/2021   Moderna SARS-COV2 Booster Vaccination 10/06/2021   PFIZER(Purple Top)SARS-COV-2 Vaccination 01/05/2020, 01/26/2020, 08/27/2020   PNEUMOCOCCAL CONJUGATE-20 11/23/2021   Pneumococcal Polysaccharide-23 09/29/2020   Tdap 05/12/2012   Zoster, Live 07/13/2015    Screening Tests Health Maintenance  Topic Date Due   Zoster Vaccines- Shingrix (1 of 2) 06/26/1974   DTaP/Tdap/Td (2 - Td or Tdap) 05/12/2022   COVID-19 Vaccine (4 - 2024-25 season) 07/02/2023   INFLUENZA VACCINE  05/31/2024   Medicare Annual Wellness (AWV)  02/06/2025   Colonoscopy  03/25/2029   Pneumonia Vaccine 74+ Years old  Completed   Hepatitis C Screening  Completed   HPV VACCINES  Aged Out    Health Maintenance  Health Maintenance Due  Topic Date Due   Zoster  Vaccines- Shingrix (1 of 2) 06/26/1974   DTaP/Tdap/Td (2 - Td or Tdap) 05/12/2022   COVID-19 Vaccine (4 - 2024-25 season) 07/02/2023   Health Maintenance Items Addressed: NEEDS TDAP, DECLINES MORE COVID, STATES HAD SHINGRIX @ [facility]  Additional Screening:  Vision Screening: Recommended annual ophthalmology exams for early detection of glaucoma and other disorders of the eye.  Dental Screening: Recommended annual dental exams for proper oral hygiene  Community Resource Referral / Chronic Care Management: CRR required this visit?  No   CCM required this visit?  No     Plan:     I have personally reviewed and noted the following in the patient's chart:   Medical and social history Use of alcohol, tobacco or illicit drugs  Current medications and supplements including opioid prescriptions. Patient is not currently taking opioid prescriptions. Functional ability and status Nutritional status Physical activity Advanced directives List of other physicians Hospitalizations, surgeries, and ER visits in previous 12 months Vitals Screenings to include cognitive, depression, and falls Referrals and appointments  In addition, I have reviewed and discussed with patient certain preventive protocols, quality metrics, and best practice recommendations. A written personalized care plan for preventive services as well as general preventive  health recommendations were provided to patient.     Hal Hope, LPN   4/0/9811   After Visit Summary: (MyChart) Due to this being a telephonic visit, the after visit summary with patients personalized plan was offered to patient via MyChart   Notes: Nothing significant to report at this time.

## 2024-02-07 NOTE — Patient Instructions (Addendum)
 Arthur Davis , Thank you for taking time to come for your Medicare Wellness Visit. I appreciate your ongoing commitment to your health goals. Please review the following plan we discussed and let me know if I can assist you in the future.   Referrals/Orders/Follow-Ups/Clinician Recommendations: NONE  This is a list of the screening recommended for you and due dates:  Health Maintenance  Topic Date Due   Zoster (Shingles) Vaccine (1 of 2) 06/26/1974   DTaP/Tdap/Td vaccine (2 - Td or Tdap) 05/12/2022   COVID-19 Vaccine (4 - 2024-25 season) 07/02/2023   Flu Shot  05/31/2024   Medicare Annual Wellness Visit  02/06/2025   Colon Cancer Screening  03/25/2029   Pneumonia Vaccine  Completed   Hepatitis C Screening  Completed   HPV Vaccine  Aged Out    Advanced directives: (ACP Link)Information on Advanced Care Planning can be found at [facility] [place] Advance Health Care Directives Advance Health Care Directives. [email or web address]   Next Medicare Annual Wellness Visit scheduled for next year: Yes  02/12/25 @ 1:50 PM BY PHONE

## 2024-04-07 ENCOUNTER — Other Ambulatory Visit: Payer: Self-pay | Admitting: Family Medicine

## 2024-04-07 DIAGNOSIS — K219 Gastro-esophageal reflux disease without esophagitis: Secondary | ICD-10-CM

## 2024-05-22 ENCOUNTER — Other Ambulatory Visit: Payer: Self-pay | Admitting: Family Medicine

## 2024-05-22 DIAGNOSIS — R7303 Prediabetes: Secondary | ICD-10-CM

## 2024-05-22 DIAGNOSIS — I10 Essential (primary) hypertension: Secondary | ICD-10-CM

## 2024-11-19 ENCOUNTER — Other Ambulatory Visit: Payer: Self-pay | Admitting: Family Medicine

## 2024-11-19 DIAGNOSIS — R7303 Prediabetes: Secondary | ICD-10-CM

## 2024-11-19 DIAGNOSIS — I10 Essential (primary) hypertension: Secondary | ICD-10-CM

## 2024-11-19 DIAGNOSIS — G43809 Other migraine, not intractable, without status migrainosus: Secondary | ICD-10-CM

## 2024-11-19 MED ORDER — AMLODIPINE BESYLATE 2.5 MG PO TABS: 2.5000 mg | ORAL_TABLET | Freq: Every day | ORAL | 0 refills | Status: DC

## 2024-11-19 MED ORDER — TOPIRAMATE 50 MG PO TABS: 50.0000 mg | ORAL_TABLET | Freq: Every day | ORAL | 0 refills | Status: AC

## 2024-11-19 MED ORDER — METFORMIN HCL ER 500 MG PO TB24: 500.0000 mg | ORAL_TABLET | Freq: Every day | ORAL | 0 refills | Status: DC

## 2024-11-19 NOTE — Telephone Encounter (Signed)
" °  Last Visit: 01/08/2024 Next Visit: Visit date not scheduled  Last Refill: 05/22/24 #90 3rf, 01/26/24 #90 3rf  Please Advise  "

## 2024-11-19 NOTE — Telephone Encounter (Addendum)
 [facility] faxed refill request for the following medications:  topiramate  (TOPAMAX ) 50 MG tablet   metFORMIN  (GLUCOPHAGE -XR) 500 MG 24 hr tablet   amLODipine  (NORVASC ) 2.5 MG tablet   Please advise.

## 2024-11-26 ENCOUNTER — Telehealth: Payer: Self-pay | Admitting: Family Medicine

## 2024-11-26 ENCOUNTER — Other Ambulatory Visit: Payer: Self-pay

## 2024-11-26 DIAGNOSIS — I10 Essential (primary) hypertension: Secondary | ICD-10-CM

## 2024-11-26 NOTE — Telephone Encounter (Signed)
 [facility] is asking for refills on Irbesartan /hydrochlorothiazide  150/12.5 mg. #90 with refills

## 2024-11-26 NOTE — Telephone Encounter (Signed)
 Received another refill request for these medications from [facility].  Looks like they were sent to [facility].  Please send to correct pharmacy.

## 2024-11-26 NOTE — Telephone Encounter (Signed)
Converted to refill request 

## 2024-12-03 ENCOUNTER — Other Ambulatory Visit: Payer: Self-pay

## 2024-12-03 ENCOUNTER — Telehealth: Payer: Self-pay | Admitting: Family Medicine

## 2024-12-03 DIAGNOSIS — I10 Essential (primary) hypertension: Secondary | ICD-10-CM

## 2024-12-03 DIAGNOSIS — G43809 Other migraine, not intractable, without status migrainosus: Secondary | ICD-10-CM

## 2024-12-03 DIAGNOSIS — R7303 Prediabetes: Secondary | ICD-10-CM

## 2024-12-03 MED ORDER — AMLODIPINE BESYLATE 2.5 MG PO TABS: 2.5000 mg | ORAL_TABLET | Freq: Every day | ORAL | 0 refills | Status: DC

## 2024-12-03 MED ORDER — IRBESARTAN-HYDROCHLOROTHIAZIDE 150-12.5 MG PO TABS: 1.0000 | ORAL_TABLET | Freq: Every day | ORAL | 3 refills | Status: AC

## 2024-12-03 MED ORDER — METFORMIN HCL ER 500 MG PO TB24: 500.0000 mg | ORAL_TABLET | Freq: Every day | ORAL | 0 refills | Status: DC

## 2024-12-03 MED ORDER — AMLODIPINE BESYLATE 2.5 MG PO TABS: 2.5000 mg | ORAL_TABLET | Freq: Every day | ORAL | 0 refills | Status: AC

## 2024-12-03 NOTE — Telephone Encounter (Signed)
 I sent in courtesy refill for Amlodipine  and Metformin .

## 2024-12-03 NOTE — Telephone Encounter (Signed)
 [facility] faxed refill request for the following medications:  irbesartan -hydrochlorothiazide  (AVALIDE) 150-12.5 MG tablet     Please advise.

## 2024-12-03 NOTE — Telephone Encounter (Signed)
 [facility] faxed refill request for the following medications:  amLODipine  (NORVASC ) 2.5 MG tablet     Please advise.

## 2024-12-06 ENCOUNTER — Ambulatory Visit: Admitting: Family Medicine

## 2024-12-06 ENCOUNTER — Encounter: Payer: Self-pay | Admitting: Family Medicine

## 2024-12-06 VITALS — BP 136/72 | HR 59 | Resp 16 | Ht 70.0 in | Wt 209.0 lb

## 2024-12-06 DIAGNOSIS — R7303 Prediabetes: Secondary | ICD-10-CM

## 2024-12-06 DIAGNOSIS — Z23 Encounter for immunization: Secondary | ICD-10-CM

## 2024-12-06 DIAGNOSIS — E669 Obesity, unspecified: Secondary | ICD-10-CM

## 2024-12-06 DIAGNOSIS — N400 Enlarged prostate without lower urinary tract symptoms: Secondary | ICD-10-CM

## 2024-12-06 DIAGNOSIS — R3911 Hesitancy of micturition: Secondary | ICD-10-CM

## 2024-12-06 DIAGNOSIS — I10 Essential (primary) hypertension: Secondary | ICD-10-CM

## 2024-12-06 DIAGNOSIS — B351 Tinea unguium: Secondary | ICD-10-CM

## 2024-12-06 DIAGNOSIS — G4733 Obstructive sleep apnea (adult) (pediatric): Secondary | ICD-10-CM

## 2024-12-06 DIAGNOSIS — G43709 Chronic migraine without aura, not intractable, without status migrainosus: Secondary | ICD-10-CM

## 2024-12-06 DIAGNOSIS — E559 Vitamin D deficiency, unspecified: Secondary | ICD-10-CM

## 2024-12-06 DIAGNOSIS — K219 Gastro-esophageal reflux disease without esophagitis: Secondary | ICD-10-CM

## 2024-12-06 DIAGNOSIS — F900 Attention-deficit hyperactivity disorder, predominantly inattentive type: Secondary | ICD-10-CM

## 2024-12-06 DIAGNOSIS — N401 Enlarged prostate with lower urinary tract symptoms: Secondary | ICD-10-CM

## 2024-12-06 DIAGNOSIS — Z125 Encounter for screening for malignant neoplasm of prostate: Secondary | ICD-10-CM

## 2024-12-06 MED ORDER — METFORMIN HCL ER 500 MG PO TB24: 500.0000 mg | ORAL_TABLET | Freq: Every day | ORAL | 0 refills | Status: AC

## 2024-12-06 MED ORDER — TAMSULOSIN HCL 0.4 MG PO CAPS: 0.4000 mg | ORAL_CAPSULE | Freq: Every day | ORAL | 3 refills | Status: AC

## 2024-12-06 NOTE — Patient Instructions (Signed)
 Arthur Davis  Please review the attached list of medications and notify my office if there are any errors.   . Please bring all of your medications to every appointment so we can make sure that our medication list is the same as yours.

## 2024-12-06 NOTE — Progress Notes (Signed)
 "     Established patient visit   Patient: Arthur Davis   DOB: [birth year]   69 y.o. Male  MRN: [id number] Visit Date: 12/06/2024  Today's healthcare provider: Nancyann Perry, MD   Chief Complaint  Patient presents with   Follow-up   Diabetes    DM f/u pt wants labs    Subjective    Discussed the use of AI scribe software for clinical note transcription with the patient, who gave verbal consent to proceed.  History of Present Illness   Arthur Davis is a 69 year old male who presents for a routine checkup to monitor blood pressure and blood sugar levels.  He monitors his blood pressure at home, noting that readings are consistent with those taken in the office, sometimes running a little lower. No chest pain, heart flutters, or shortness of breath, although he has always experienced shortness of breath with exertion.  He experiences trouble with urination, which seems to be worsening over time.  He has fungal infections on both big toenails, which he manages to keep at bay.  He uses a back brace and stays active by working every day.  He takes vitamin D , adjusting the dosage seasonally by increasing it in the winter and decreasing it in the summer due to increased outdoor activity.     Lab Results  Component Value Date   VD25OH 57.5 01/09/2024   Lab Results  Component Value Date   CHOL 158 01/09/2024   HDL 43 01/09/2024   LDLCALC 84 01/09/2024   TRIG 179 (H) 01/09/2024   CHOLHDL 3.7 01/09/2024   Lab Results  Component Value Date   NA 141 01/09/2024   K 3.9 01/09/2024   CREATININE 0.96 01/09/2024   EGFR 86 01/09/2024   GLUCOSE 125 (H) 01/09/2024     Medications: Show/hide medication list[1] Review of Systems     Objective    BP 136/72 (BP Location: Left Arm, Patient Position: Sitting, Cuff Size: Large)   Pulse (!) 59   Resp 16   Ht 5' 10 (1.778 m)   Wt 209 lb (94.8 kg)   SpO2 97%   BMI 29.99 kg/m   Physical Exam   General: Appearance:    Well  developed, well nourished male in no acute distress  Eyes:    PERRL, conjunctiva/corneas clear, EOM's intact       Lungs:     Clear to auscultation bilaterally, respirations unlabored  Heart:    Bradycardic. Normal rhythm. No murmurs, rubs, or gallops.    MS:   All extremities are intact.    Neurologic:   Awake, alert, oriented x 3. No apparent focal neurological defect.        Assessment & Plan       Essential hypertension Blood pressure well-controlled with current management. - Continue current antihypertensive regimen.  Benign prostatic hyperplasia with lower urinary tract symptoms Worsening urinary symptoms. Discussed Flomax  as treatment option. Main side effect: potential lightheadedness due to transient hypotension. - Prescribed Flomax  for urinary symptoms.  Vitamin D  deficiency Vitamin D  levels managed with seasonal supplementation adjustments. Monitored to prevent hypercalcemia. - Continue current vitamin D  supplementation regimen with seasonal adjustments.  Onychomycosis of bilateral great toes Fungal infection on both great toenails.  General Health Maintenance Has not received flu shot this year. Discussed importance of vaccination.  Chronic Migraine -  Doing well on current dose of topiramate         Nancyann Perry, MD  [facility] [phone] (phone) [phone] (fax)  [facility]    [1]  Outpatient Medications Prior to Visit  Medication Sig   amLODipine  (NORVASC ) 2.5 MG tablet Take 1 tablet (2.5 mg total) by mouth daily.   Cholecalciferol 125 MCG (5000 UT) TABS Take 1,000 Units by mouth daily.   Flaxseed, Linseed, (RA FLAX SEED OIL 1000 PO) Take 1 capsule by mouth daily.   irbesartan -hydrochlorothiazide  (AVALIDE) 150-12.5 MG tablet Take 1 tablet by mouth daily. Please schedule office visit before any future refill.   Magnesium  Oxide -Mg Supplement 500 MG TABS TAKE 1 TABLET EVERY DAY   meloxicam  (MOBIC ) 7.5 MG  tablet Take 1 tablet (7.5 mg total) by mouth daily.   Omega-3 Fatty Acids (FISH OIL CONCENTRATE) 1000 MG CAPS Take 1 capsule by mouth daily.    omeprazole  (PRILOSEC) 40 MG capsule TAKE 1 CAPSULE EVERY DAY (NEED MD APPOINTMENT)   topiramate  (TOPAMAX ) 50 MG tablet Take 1 tablet (50 mg total) by mouth daily.   zolpidem  (AMBIEN ) 10 MG tablet Take 1 tablet (10 mg total) by mouth at bedtime as needed for sleep.   [DISCONTINUED] metFORMIN  (GLUCOPHAGE -XR) 500 MG 24 hr tablet Take 1 tablet (500 mg total) by mouth daily.   No facility-administered medications prior to visit.   "

## 2025-02-12 ENCOUNTER — Ambulatory Visit
